# Patient Record
Sex: Male | Born: 1970 | Race: White | Hispanic: No | Marital: Single | Smoking: Current every day smoker
Health system: Southern US, Community
[De-identification: ages and names within clinical notes are randomized; demographics above are authoritative.]

## PROBLEM LIST (undated history)

## (undated) DIAGNOSIS — T7840XA Allergy, unspecified, initial encounter: Secondary | ICD-10-CM

## (undated) DIAGNOSIS — F419 Anxiety disorder, unspecified: Secondary | ICD-10-CM

## (undated) DIAGNOSIS — F32A Depression, unspecified: Secondary | ICD-10-CM

## (undated) DIAGNOSIS — M810 Age-related osteoporosis without current pathological fracture: Secondary | ICD-10-CM

## (undated) DIAGNOSIS — K219 Gastro-esophageal reflux disease without esophagitis: Secondary | ICD-10-CM

## (undated) DIAGNOSIS — J449 Chronic obstructive pulmonary disease, unspecified: Secondary | ICD-10-CM

## (undated) DIAGNOSIS — C801 Malignant (primary) neoplasm, unspecified: Secondary | ICD-10-CM

## (undated) DIAGNOSIS — F329 Major depressive disorder, single episode, unspecified: Secondary | ICD-10-CM

## (undated) HISTORY — DX: Age-related osteoporosis without current pathological fracture: M81.0

## (undated) HISTORY — PX: NEPHRECTOMY: SHX65

## (undated) HISTORY — DX: Anxiety disorder, unspecified: F41.9

## (undated) HISTORY — DX: Depression, unspecified: F32.A

## (undated) HISTORY — DX: Gastro-esophageal reflux disease without esophagitis: K21.9

## (undated) HISTORY — PX: UPPER GASTROINTESTINAL ENDOSCOPY: SHX188

## (undated) HISTORY — DX: Chronic obstructive pulmonary disease, unspecified: J44.9

## (undated) HISTORY — PX: APPENDECTOMY: SHX54

## (undated) HISTORY — PX: BACK SURGERY: SHX140

## (undated) HISTORY — DX: Allergy, unspecified, initial encounter: T78.40XA

## (undated) HISTORY — DX: Malignant (primary) neoplasm, unspecified: C80.1

---
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
## 2016-04-03 ENCOUNTER — Encounter (HOSPITAL_COMMUNITY): Payer: Self-pay | Admitting: Emergency Medicine

## 2016-04-03 ENCOUNTER — Emergency Department (HOSPITAL_COMMUNITY)
Admission: EM | Admit: 2016-04-03 | Discharge: 2016-04-03 | Disposition: A | Discharge status: Home / Self Care | Payer: Self-pay | Attending: Dermatology | Admitting: Dermatology

## 2016-04-03 DIAGNOSIS — Z5321 Procedure and treatment not carried out due to patient leaving prior to being seen by health care provider: Secondary | ICD-10-CM | POA: Insufficient documentation

## 2016-04-03 DIAGNOSIS — G8929 Other chronic pain: Secondary | ICD-10-CM | POA: Insufficient documentation

## 2016-04-03 DIAGNOSIS — M549 Dorsalgia, unspecified: Secondary | ICD-10-CM | POA: Insufficient documentation

## 2016-04-03 NOTE — ED Notes (Signed)
This RN Called Pt in Waiting room x3. No response.

## 2016-04-03 NOTE — ED Triage Notes (Signed)
Per pt, states chronic back pain-states increased pain for past 2 weeks

## 2018-10-23 ENCOUNTER — Encounter: Payer: Self-pay | Admitting: Gastroenterology

## 2018-10-25 ENCOUNTER — Other Ambulatory Visit: Payer: Self-pay

## 2018-10-25 ENCOUNTER — Emergency Department (HOSPITAL_COMMUNITY): Payer: Commercial Managed Care - PPO

## 2018-10-25 ENCOUNTER — Encounter (HOSPITAL_COMMUNITY): Payer: Self-pay

## 2018-10-25 ENCOUNTER — Emergency Department (HOSPITAL_COMMUNITY)
Admission: EM | Admit: 2018-10-25 | Discharge: 2018-10-25 | Disposition: A | Discharge status: Home / Self Care | Payer: Commercial Managed Care - PPO | Attending: Emergency Medicine | Admitting: Emergency Medicine

## 2018-10-25 DIAGNOSIS — R1084 Generalized abdominal pain: Secondary | ICD-10-CM

## 2018-10-25 DIAGNOSIS — F17200 Nicotine dependence, unspecified, uncomplicated: Secondary | ICD-10-CM | POA: Diagnosis not present

## 2018-10-25 DIAGNOSIS — K760 Fatty (change of) liver, not elsewhere classified: Secondary | ICD-10-CM | POA: Diagnosis not present

## 2018-10-25 DIAGNOSIS — J449 Chronic obstructive pulmonary disease, unspecified: Secondary | ICD-10-CM | POA: Insufficient documentation

## 2018-10-25 DIAGNOSIS — R748 Abnormal levels of other serum enzymes: Secondary | ICD-10-CM | POA: Diagnosis not present

## 2018-10-25 DIAGNOSIS — Z20828 Contact with and (suspected) exposure to other viral communicable diseases: Secondary | ICD-10-CM | POA: Diagnosis not present

## 2018-10-25 LAB — RAPID URINE DRUG SCREEN, HOSP PERFORMED
Amphetamines: NOT DETECTED
Barbiturates: NOT DETECTED
Benzodiazepines: POSITIVE — AB
Cocaine: NOT DETECTED
Opiates: NOT DETECTED
Tetrahydrocannabinol: POSITIVE — AB

## 2018-10-25 LAB — COMPREHENSIVE METABOLIC PANEL
ALT: 84 U/L — ABNORMAL HIGH (ref 0–44)
AST: 82 U/L — ABNORMAL HIGH (ref 15–41)
Albumin: 3.8 g/dL (ref 3.5–5.0)
Alkaline Phosphatase: 66 U/L (ref 38–126)
Anion gap: 10 (ref 5–15)
BUN: 9 mg/dL (ref 6–20)
CO2: 27 mmol/L (ref 22–32)
Calcium: 9.1 mg/dL (ref 8.9–10.3)
Chloride: 98 mmol/L (ref 98–111)
Creatinine, Ser: 0.93 mg/dL (ref 0.61–1.24)
GFR calc Af Amer: >60 mL/min (ref >60)
GFR calc non Af Amer: >60 mL/min (ref >60)
Glucose, Bld: 170 mg/dL — ABNORMAL HIGH (ref 70–99)
Potassium: 4.3 mmol/L (ref 3.5–5.1)
Sodium: 135 mmol/L (ref 135–145)
Total Bilirubin: 0.6 mg/dL (ref 0.3–1.2)
Total Protein: 6.5 g/dL (ref 6.5–8.1)

## 2018-10-25 LAB — URINALYSIS, ROUTINE W REFLEX MICROSCOPIC
Bilirubin Urine: NEGATIVE
Glucose, UA: NEGATIVE mg/dL
Hgb urine dipstick: NEGATIVE
Ketones, ur: NEGATIVE mg/dL
Leukocytes,Ua: NEGATIVE
Nitrite: NEGATIVE
Protein, ur: NEGATIVE mg/dL
Specific Gravity, Urine: 1.031 — ABNORMAL HIGH (ref 1.005–1.030)
pH: 5 (ref 5.0–8.0)

## 2018-10-25 LAB — CBC WITH DIFFERENTIAL/PLATELET
Abs Immature Granulocytes: 0.03 10*3/uL (ref 0.00–0.07)
Basophils Absolute: 0 10*3/uL (ref 0.0–0.1)
Basophils Relative: 1 %
Eosinophils Absolute: 0.3 10*3/uL (ref 0.0–0.5)
Eosinophils Relative: 5 %
HCT: 48.5 % (ref 39.0–52.0)
Hemoglobin: 16.2 g/dL (ref 13.0–17.0)
Immature Granulocytes: 1 %
Lymphocytes Relative: 31 %
Lymphs Abs: 1.8 10*3/uL (ref 0.7–4.0)
MCH: 34.6 pg — ABNORMAL HIGH (ref 26.0–34.0)
MCHC: 33.4 g/dL (ref 30.0–36.0)
MCV: 103.6 fL — ABNORMAL HIGH (ref 80.0–100.0)
Monocytes Absolute: 0.4 10*3/uL (ref 0.1–1.0)
Monocytes Relative: 6 %
Neutro Abs: 3.2 10*3/uL (ref 1.7–7.7)
Neutrophils Relative %: 56 %
Platelets: 188 10*3/uL (ref 150–400)
RBC: 4.68 MIL/uL (ref 4.22–5.81)
RDW: 11.9 % (ref 11.5–15.5)
WBC: 5.6 10*3/uL (ref 4.0–10.5)
nRBC: 0 % (ref 0.0–0.2)

## 2018-10-25 LAB — LIPASE, BLOOD: Lipase: 48 U/L (ref 11–51)

## 2018-10-25 LAB — PROTIME-INR
INR: 1 (ref 0.8–1.2)
Prothrombin Time: 13.3 seconds (ref 11.4–15.2)

## 2018-10-25 LAB — AMMONIA: Ammonia: 28 umol/L (ref 9–35)

## 2018-10-25 LAB — SARS CORONAVIRUS 2 (TAT 6-24 HRS): SARS Coronavirus 2: NEGATIVE

## 2018-10-25 LAB — ETHANOL: Alcohol, Ethyl (B): <10 mg/dL (ref <10)

## 2018-10-25 MED ORDER — MORPHINE SULFATE (PF) 4 MG/ML IV SOLN
8.0000 mg | Freq: Once | INTRAVENOUS | Status: AC
  Administered 2018-10-25: 8 mg via INTRAMUSCULAR
  Filled 2018-10-25: qty 2

## 2018-10-25 MED ORDER — AEROCHAMBER PLUS FLO-VU LARGE MISC
Status: AC
  Filled 2018-10-25: qty 1

## 2018-10-25 MED ORDER — AEROCHAMBER PLUS FLO-VU MEDIUM MISC
1.0000 | Freq: Once | Status: AC
  Administered 2018-10-25: 16:00:00 1
  Filled 2018-10-25: qty 1

## 2018-10-25 MED ORDER — ALBUTEROL SULFATE HFA 108 (90 BASE) MCG/ACT IN AERS
2.0000 | INHALATION_SPRAY | RESPIRATORY_TRACT | Status: DC | PRN
  Administered 2018-10-25: 16:00:00 2 via RESPIRATORY_TRACT
  Filled 2018-10-25: qty 6.7

## 2018-10-25 MED ORDER — FENTANYL CITRATE (PF) 100 MCG/2ML IJ SOLN
50.0000 ug | Freq: Once | INTRAMUSCULAR | Status: AC
  Administered 2018-10-25: 50 ug via INTRAVENOUS
  Filled 2018-10-25: qty 2

## 2018-10-25 MED ORDER — IOHEXOL 300 MG/ML  SOLN
100.0000 mL | Freq: Once | INTRAMUSCULAR | Status: AC | PRN
  Administered 2018-10-25: 100 mL via INTRAVENOUS

## 2018-10-25 NOTE — ED Triage Notes (Signed)
States ruptured appy in January and has continued pain since, intermittently.  Has had multiple scans with no diagnosis.  Was seen at UC 2 days ago and had ultrasound with diagnosis of fatty liver without cirrhosis.  Admits to ETOH use approx 4 beers a day.  Starts drinking at lunch time.  After [place] has not had a drink since.  Pt guarding at RUQ, with some dyspnea and cough noted.  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  No N/V/D.  Wife at bedside.

## 2018-10-25 NOTE — ED Notes (Signed)
Pt continues to have pain at a "10".  Msg to MD for pain meds.

## 2018-10-25 NOTE — Discharge Instructions (Addendum)
Please use albuterol inhaler 2 puffs up to every 4 hours Do not drink alcohol, smoke, or use acetaminophen. Return if any fever, unable to tolerate fluids by mouth or worsening symptoms.

## 2018-10-25 NOTE — ED Provider Notes (Signed)
[facility] EMERGENCY DEPARTMENT Provider Note   CSN: [id number] Arrival date & time: 10/25/18  1148     History   Chief Complaint Chief Complaint  Patient presents with   Abdominal Pain    HPI Alan Blackwell is a 48 y.o. male.     HPI  48 yo male a/p appendectomy January 2020 presents co of ongoing abdominal pain since appendectomy last January..  Patient complaining of distension which is worsening and with diffuse pain.  Wife states he gained 25 lbs at time of surgery and has decreased ongoing po intake.  Denies fever, cough, nausea or vomiitng.  Patient with increased dyspnea which he feels is related to abdominal distension.  Patient smoke and drinks daily.  Seen at primary care office yesterday and has had recent ruq [place] which showed fatty inflitrate of liver, but no stones or obstruction.    No past medical history on file.  There are no active problems to display for this patient.   Past Surgical History:  Procedure Laterality Date   BACK SURGERY          Home Medications    Prior to Admission medications   Not on File    Family History No family history on file.  Social History Social History   Tobacco Use   Smoking status: Current Every Day Smoker  Substance Use Topics   Alcohol use: No   Drug use: Not on file     Allergies   Patient has no allergy information on record.   Review of Systems Review of Systems   Physical Exam Updated Vital Signs BP (!) 152/107 (BP Location: Right Arm)    Pulse 99    Temp 98 F (36.7 C) (Oral)    Resp (!) 22    Ht 1.651 m (5\' 5" )    Wt 84.8 kg    SpO2 100%    BMI 31.12 kg/m   Physical Exam Vitals signs and nursing note reviewed.  Constitutional:      Appearance: He is well-developed. He is obese.  HENT:     Head: Normocephalic.     Mouth/Throat:     Mouth: Mucous membranes are moist.  Eyes:     Extraocular Movements: Extraocular movements intact.  Cardiovascular:     Rate and  Rhythm: Normal rate and regular rhythm.  Pulmonary:     Effort: Pulmonary effort is normal.     Breath sounds: Normal breath sounds.  Abdominal:     General: Abdomen is protuberant. Bowel sounds are normal. There is distension.     Tenderness: There is generalized abdominal tenderness.     Comments: Diffuse ttp  Skin:    General: Skin is warm and dry.     Capillary Refill: Capillary refill takes less than 2 seconds.  Neurological:     General: No focal deficit present.     Mental Status: He is alert. He is disoriented.  Psychiatric:        Mood and Affect: Mood normal.        Behavior: Behavior normal.      ED Treatments / Results  Labs (all labs ordered are listed, but only abnormal results are displayed) Labs Reviewed  SARS CORONAVIRUS 2 (TAT 6-24 HRS)  CBC WITH DIFFERENTIAL/PLATELET  COMPREHENSIVE METABOLIC PANEL  LIPASE, BLOOD  PROTIME-INR    EKG None  Radiology Ct Abdomen Pelvis W Contrast  Result Date: 10/25/2018 CLINICAL DATA:  Acute, diffuse abdominal pain. Continued pain since a  ruptured appendix in January 2020. EXAM: CT ABDOMEN AND PELVIS WITH CONTRAST TECHNIQUE: Multidetector CT imaging of the abdomen and pelvis was performed using the standard protocol following bolus administration of intravenous contrast. CONTRAST:  149mL OMNIPAQUE IOHEXOL 300 MG/ML  SOLN COMPARISON:  06/19/2018 FINDINGS: Lower chest: Normal sized heart. Minimal bibasilar dependent atelectasis/scarring. Small right middle lobe bulla. Hepatobiliary: Marked diffuse low density of the liver relative to the spleen with mild progression. There are some ill-defined areas of relative sparing peripherally. Normal appearing gallbladder. Pancreas: Unremarkable. No pancreatic ductal dilatation or surrounding inflammatory changes. Spleen: Normal in size without focal abnormality. Adrenals/Urinary Tract: Adrenal glands are unremarkable. Kidneys are normal, without renal calculi, focal lesion, or hydronephrosis.  Bladder is unremarkable. Stomach/Bowel: Post appendectomy changes. Unremarkable stomach, small bowel and colon. Vascular/Lymphatic: Mild atheromatous arterial calcifications without aneurysm. No enlarged lymph nodes. Reproductive: Prostate is unremarkable. Other: Small bilateral inguinal hernias containing fat. Surgical clips in the lateral aspect of the mid abdomen, compatible with clips placed at the time of appendectomy. Musculoskeletal: Lower thoracic spine degenerative changes. Minimal lumbar spine degenerative changes. IMPRESSION: 1. No acute abnormality. 2. Marked diffuse hepatic steatosis with mild progression. 3. Small bilateral inguinal hernias containing fat. Electronically Signed   By: Claudie Revering M.D.   On: 10/25/2018 14:31   Dg Chest Port 1 View  Result Date: 10/25/2018 CLINICAL DATA:  Dyspnea EXAM: PORTABLE CHEST 1 VIEW COMPARISON:  01/28/2016 FINDINGS: The heart size and mediastinal contours are within normal limits. Minimal bibasilar atelectasis. No pleural effusion, or pneumothorax. The visualized skeletal structures are unremarkable. IMPRESSION: Minimal bibasilar atelectasis. Electronically Signed   By: Davina Poke M.D.   On: 10/25/2018 12:50    Procedures Procedures (including critical care time)  Medications Ordered in ED Medications - No data to display   Initial Impression / Assessment and Plan / ED Course  I have reviewed the triage vital signs and the nursing notes.  Pertinent labs & imaging results that were available during my care of the patient were reviewed by me and considered in my medical decision making (see chart for details).   48 year old male presents today complaining of abdominal pain since appendectomy last January.  He has been evaluated by his primary care doctor and had recent ultrasound and labs which showed mildly elevated LFTs.  He drinks alcohol but reports that he has quit drinking alcohol.  He is on maintenance Subutex but states he quit  taking it 2 days ago as it does not work very well for him.  He also states that he quit taking it because he did not want to block the opiate receptors of a cane for treatment.  His wife is with him.  They are insistent that he had sudden weight gain after the surgery.  I have reviewed the CT scan and discussed it with the radiologist.  There is no evidence of ascites or other acute abnormalities although there is marked hepatic steatosis.  However, can find no evidence of acute findings today.  He was given fentanyl here for pain.  He is requesting further pain medication.  I discussed that I am unable to prescribe further narcotics as an outpatient.  He will be given IM morphine here before he leaves.  He will restart his Subutex when he gets home. I had an extensive conversation with patient and his wife.  See no indication for hospitalization or further treatment at this time.  I suspect that he does have some component of hepatitis.  He is  advised regarding not using any alcohol or acetaminophen. Patient also has COPD and has some wheezing.  This seems to be exacerbated by his abdominal pain.  He is given an albuterol HFA with a spacer and advised regarding its use. Plan referral to GI for further evaluation. They are aware of return precautions and need for follow-up and voiced understanding.    Final Clinical Impressions(s) / ED Diagnoses   Final diagnoses:  Generalized abdominal pain  Chronic obstructive pulmonary disease, unspecified COPD type ([place])  Elevated liver enzymes    ED Discharge Orders    None       Pattricia Boss, MD 10/25/18 1546

## 2018-11-10 ENCOUNTER — Encounter: Payer: Self-pay | Admitting: Gastroenterology

## 2018-11-10 ENCOUNTER — Ambulatory Visit: Payer: Commercial Managed Care - PPO | Admitting: Gastroenterology

## 2018-11-10 ENCOUNTER — Other Ambulatory Visit: Payer: Self-pay

## 2018-11-10 VITALS — BP 138/92 | HR 96 | Temp 98.2°F | Ht 65.0 in | Wt 188.5 lb

## 2018-11-10 DIAGNOSIS — R635 Abnormal weight gain: Secondary | ICD-10-CM | POA: Diagnosis not present

## 2018-11-10 DIAGNOSIS — R109 Unspecified abdominal pain: Secondary | ICD-10-CM | POA: Diagnosis not present

## 2018-11-10 MED ORDER — SUPREP BOWEL PREP KIT 17.5-3.13-1.6 GM/177ML PO SOLN: 1.0000 | ORAL | 0 refills | Status: DC

## 2018-11-10 MED ORDER — PANTOPRAZOLE SODIUM 40 MG PO TBEC: 40.0000 mg | DELAYED_RELEASE_TABLET | Freq: Every day | ORAL | 11 refills | Status: AC

## 2018-11-10 NOTE — Progress Notes (Addendum)
Chief Complaint:   Referring Provider:  No ref. provider found      ASSESSMENT AND PLAN;   #1.  Generalized abdominal pain. Neg CT for any etiology except for significant fatty liver.  He does have history of GERD, alternating diarrhea and constipation.  #2.  Fatty liver with Abn LFTs likely d/t ETOH. R/O other causes.  No definite liver cirrhosis at this time but patient is at a higher risk.  Normal albumin and platelet count.    #3.  Rapid wt gain. ?  Etiology  Plan: - Stop all ETOH and sodas. - I have offered dietary consultation for calorie intake.  He wants to hold off. - protonix 40mg  po qd. - Check CBC, CMP, TSH, celiac, acute hep panel, acute viral hepatitis, autoimmune hepatitis panel (AMA, ASMA and anti-LKM), ANA, iron studies, serum ceruloplasmin, A1AT, celiac screen, GGT, PT INR and HBA1c - Check anti-HAV total Ab and HBsAb.  If negative, would recommend vaccination for hepatitis A and B. - Proceed with EGD/colon. Discussed risks & benefits. (Risks including rare perforation req laparotomy, bleeding after biopsies/polypectomy req blood transfusion, rare chance of missing neoplasms, risks of anesthesia/sedation). Benefits outweigh the risks. Patient agrees to proceed. All the questions were answered. Consent forms given for review. -Discussed gradual weight loss.  Stop all fried foods as well.  Monitor weight.     Addendum-urine tox screen 2 weeks ago was positive for benzodiazepines and THC.  Alan Blackwell, please note to add hemoglobin A1c as well  HPI:    Alan Blackwell is a 48 y.o. male  C/O generalized abdominal pain particularly upper abdominal and lower abdominal discomfort since lap appendicectomy d/t perforated appendix January 2020.  Also complains of progressive weight gain.  " Went for surgery at 145 lb but when discharged few days after, weight was 165 lb".  He weighs 188lb today.  " Not eating much but still gaining weight".  Has gained so much weight that  now he has shortness of breath.  He does not have good appetite.  He has been having significant heartburn despite Protonix.  Complains of abdominal distention especially postprandial.  Has alternating diarrhea and constipation without any definite melena or hematochezia.  Seen in multiple EDs, including [place] on 10/24/2008.  Underwent CT Abdo/pelvis with contrast which did not show any acute abnormalities except for marked diffuse hepatic steatosis, small bilateral inguinal hernias containing fat.  No ascites.  No intra-abdominal abscesses.  Ultrasound on 10/22/2018 also showed hepatic steatosis without ascites.  Color Doppler imaging showed normal direction of blood flow towards the liver.  Labs revealed abnormal AST/ALT as detailed below.  He continues to drink alcohol and smokes every day.  Told me that he would drink 3-4 beers per day.  Has been drinking significant sodas and also water.  SH-works in Occupational psychologist Past Medical History:  Diagnosis Date   GERD (gastroesophageal reflux disease)     Past Surgical History:  Procedure Laterality Date   APPENDECTOMY     BACK SURGERY      Family History  Problem Relation Age of Onset   Lung cancer Father    Cancer Sister    Colon cancer Neg Hx     Social History   Tobacco Use   Smoking status: Current Every Day Smoker    Packs/day: 1.00   Smokeless tobacco: Never Used  Substance Use Topics   Alcohol use: Yes    Alcohol/week: 12.0 standard drinks    Types:  12 Cans of beer per week   Drug use: Not on file    Current Outpatient Medications  Medication Sig Dispense Refill   albuterol (PROVENTIL) (2.5 MG/3ML) 0.083% nebulizer solution Inhale 2.5 mg into the lungs as needed.     ALPRAZolam (XANAX) 0.5 MG tablet Take 0.5 mg by mouth 3 (three) times daily.     buprenorphine (SUBUTEX) 8 MG SUBL SL tablet Place 8 mg under the tongue daily.     mometasone (NASONEX) 50 MCG/ACT nasal spray Place 1  spray into the nose as needed (sinus).      pantoprazole (PROTONIX) 40 MG tablet Take 40 mg by mouth daily.     traZODone (DESYREL) 100 MG tablet Take 200 mg by mouth at bedtime.     No current facility-administered medications for this visit.     Allergies  Allergen Reactions   Naltrexone Nausea And Vomiting    Review of Systems:  Constitutional: Denies fever, chills, diaphoresis, appetite change and fatigue.  HEENT: Denies photophobia, eye pain, redness, hearing loss, ear pain, congestion, sore throat, rhinorrhea, sneezing, mouth sores, neck pain, neck stiffness and tinnitus.   Respiratory: Denies SOB, DOE, cough, chest tightness,  and wheezing.   Cardiovascular: Denies chest pain, palpitations and leg swelling.  Genitourinary: Denies dysuria, urgency, frequency, hematuria, flank pain and difficulty urinating.  Musculoskeletal: Denies myalgias, back pain, joint swelling, arthralgias and gait problem.  Skin: No rash.  Neurological: Denies dizziness, seizures, syncope, weakness, light-headedness, numbness and headaches.  Hematological: Denies adenopathy. Easy bruising, personal or family bleeding history  Psychiatric/Behavioral: Has anxiety or depression     Physical Exam:    BP (!) 138/92    Pulse 96    Temp 98.2 F (36.8 C)    Ht 5\' 5"  (1.651 m)    Wt 188 lb 8 oz (85.5 kg)    SpO2 94%    BMI 31.37 kg/m  Filed Weights   11/10/18 1444  Weight: 188 lb 8 oz (85.5 kg)   Constitutional:  Well-developed, in no acute distress. Psychiatric: Normal mood and affect. Behavior is normal. HEENT: Pupils normal.  Conjunctivae are normal. No scleral icterus. Neck supple.  Cardiovascular: Normal rate, regular rhythm. No edema Pulmonary/chest: Effort normal and breath sounds normal. No wheezing, rales or rhonchi. Abdominal: Soft, mildly distended. Nontender. Bowel sounds active throughout. There are no masses palpable.  Liver palpated 5 cm below the costal margin.  Mildly tender. Rectal:   defered Neurological: Alert and oriented to person place and time. Skin: Skin is warm and dry. No rashes noted.  Data Reviewed: I have personally reviewed following labs and imaging studies  CBC: CBC Latest Ref Rng & Units 10/25/2018  WBC 4.0 - 10.5 K/uL 5.6  Hemoglobin 13.0 - 17.0 g/dL 16.2  Hematocrit 39.0 - 52.0 % 48.5  Platelets 150 - 400 K/uL 188    CMP: CMP Latest Ref Rng & Units 10/25/2018  Glucose 70 - 99 mg/dL 170(H)  BUN 6 - 20 mg/dL 9  Creatinine 0.61 - 1.24 mg/dL 0.93  Sodium 135 - 145 mmol/L 135  Potassium 3.5 - 5.1 mmol/L 4.3  Chloride 98 - 111 mmol/L 98  CO2 22 - 32 mmol/L 27  Calcium 8.9 - 10.3 mg/dL 9.1  Total Protein 6.5 - 8.1 g/dL 6.5  Total Bilirubin 0.3 - 1.2 mg/dL 0.6  Alkaline Phos 38 - 126 U/L 66  AST 15 - 41 U/L 82(H)  ALT 0 - 44 U/L 84(H)  Albumin 3.8  Radiology Studies: Ct Abdomen  Pelvis W Contrast  Result Date: 10/25/2018 CLINICAL DATA:  Acute, diffuse abdominal pain. Continued pain since a ruptured appendix in January 2020. EXAM: CT ABDOMEN AND PELVIS WITH CONTRAST TECHNIQUE: Multidetector CT imaging of the abdomen and pelvis was performed using the standard protocol following bolus administration of intravenous contrast. CONTRAST:  178mL OMNIPAQUE IOHEXOL 300 MG/ML  SOLN COMPARISON:  06/19/2018 FINDINGS: Lower chest: Normal sized heart. Minimal bibasilar dependent atelectasis/scarring. Small right middle lobe bulla. Hepatobiliary: Marked diffuse low density of the liver relative to the spleen with mild progression. There are some ill-defined areas of relative sparing peripherally. Normal appearing gallbladder. Pancreas: Unremarkable. No pancreatic ductal dilatation or surrounding inflammatory changes. Spleen: Normal in size without focal abnormality. Adrenals/Urinary Tract: Adrenal glands are unremarkable. Kidneys are normal, without renal calculi, focal lesion, or hydronephrosis. Bladder is unremarkable. Stomach/Bowel: Post appendectomy changes.  Unremarkable stomach, small bowel and colon. Vascular/Lymphatic: Mild atheromatous arterial calcifications without aneurysm. No enlarged lymph nodes. Reproductive: Prostate is unremarkable. Other: Small bilateral inguinal hernias containing fat. Surgical clips in the lateral aspect of the mid abdomen, compatible with clips placed at the time of appendectomy. Musculoskeletal: Lower thoracic spine degenerative changes. Minimal lumbar spine degenerative changes. IMPRESSION: 1. No acute abnormality. 2. Marked diffuse hepatic steatosis with mild progression. 3. Small bilateral inguinal hernias containing fat. Electronically Signed   By: Claudie Revering M.D.   On: 10/25/2018 14:31   Dg Chest Port 1 View  Result Date: 10/25/2018 CLINICAL DATA:  Dyspnea EXAM: PORTABLE CHEST 1 VIEW COMPARISON:  01/28/2016 FINDINGS: The heart size and mediastinal contours are within normal limits. Minimal bibasilar atelectasis. No pleural effusion, or pneumothorax. The visualized skeletal structures are unremarkable. IMPRESSION: Minimal bibasilar atelectasis. Electronically Signed   By: Davina Poke M.D.   On: 10/25/2018 12:50   CT was reviewed with the patient and patient's family as well.    Carmell Austria, MD 11/10/2018, 3:26 PM  Cc: No ref. provider found

## 2018-11-10 NOTE — Patient Instructions (Signed)
If you are age 49 or older, your body mass index should be between 23-30. Your Body mass index is 31.37 kg/m. If this is out of the aforementioned range listed, please consider follow up with your Primary Care Provider.  If you are age 74 or younger, your body mass index should be between 19-25. Your Body mass index is 31.37 kg/m. If this is out of the aformentioned range listed, please consider follow up with your Primary Care Provider.   You have been scheduled for an endoscopy and colonoscopy. Please follow the written instructions given to you at your visit today. Please pick up your prep supplies at the pharmacy within the next 1-3 days. If you use inhalers (even only as needed), please bring them with you on the day of your procedure. Your physician has requested that you go to www.startemmi.com and enter the access code given to you at your visit today. This web site gives a general overview about your procedure. However, you should still follow specific instructions given to you by our office regarding your preparation for the procedure.  We have sent the following medications to your pharmacy for you to pick up at your convenience: Suprep Protonix  Please go to the lab at [facility] ([place] You will need to go to level "B", you do not need an appointment for this. Hours available are 7:30 am - 4:30 pm.   Thank you,  Dr. Jackquline Denmark

## 2018-11-11 NOTE — Addendum Note (Signed)
Addended by: Karena Addison on: 11/11/2018 09:54 AM   Modules accepted: Orders

## 2018-11-12 ENCOUNTER — Other Ambulatory Visit: Payer: Self-pay

## 2018-11-12 DIAGNOSIS — R109 Unspecified abdominal pain: Secondary | ICD-10-CM

## 2018-11-12 DIAGNOSIS — R635 Abnormal weight gain: Secondary | ICD-10-CM

## 2018-11-12 NOTE — Progress Notes (Signed)
Lab added per Dr. Steve Rattler addendum.

## 2018-11-16 ENCOUNTER — Telehealth: Payer: Self-pay

## 2018-11-16 NOTE — Telephone Encounter (Signed)
Covid-19 screening questions   Do you now or have you had a fever in the last 14 days? NO   Do you have any respiratory symptoms of shortness of breath or cough now or in the last 14 days? NO   Do you have any family members or close contacts with diagnosed or suspected Covid-19 in the past 14 days? NO   Have you been tested for Covid-19 and found to be positive? NO     Questions answered by Caryl Pina spouse.

## 2018-11-17 ENCOUNTER — Other Ambulatory Visit (INDEPENDENT_AMBULATORY_CARE_PROVIDER_SITE_OTHER): Payer: Commercial Managed Care - PPO

## 2018-11-17 ENCOUNTER — Other Ambulatory Visit: Payer: Self-pay

## 2018-11-17 ENCOUNTER — Encounter: Payer: Self-pay | Admitting: Gastroenterology

## 2018-11-17 ENCOUNTER — Ambulatory Visit (AMBULATORY_SURGERY_CENTER): Payer: Commercial Managed Care - PPO | Admitting: Gastroenterology

## 2018-11-17 VITALS — BP 156/97 | HR 74 | Temp 97.8°F | Resp 12 | Ht 65.0 in | Wt 188.0 lb

## 2018-11-17 DIAGNOSIS — K2270 Barrett's esophagus without dysplasia: Secondary | ICD-10-CM | POA: Diagnosis not present

## 2018-11-17 DIAGNOSIS — R109 Unspecified abdominal pain: Secondary | ICD-10-CM

## 2018-11-17 DIAGNOSIS — K2970 Gastritis, unspecified, without bleeding: Secondary | ICD-10-CM | POA: Diagnosis not present

## 2018-11-17 DIAGNOSIS — R635 Abnormal weight gain: Secondary | ICD-10-CM

## 2018-11-17 DIAGNOSIS — K219 Gastro-esophageal reflux disease without esophagitis: Secondary | ICD-10-CM | POA: Diagnosis not present

## 2018-11-17 DIAGNOSIS — D124 Benign neoplasm of descending colon: Secondary | ICD-10-CM

## 2018-11-17 DIAGNOSIS — R197 Diarrhea, unspecified: Secondary | ICD-10-CM | POA: Diagnosis present

## 2018-11-17 DIAGNOSIS — K5730 Diverticulosis of large intestine without perforation or abscess without bleeding: Secondary | ICD-10-CM | POA: Diagnosis not present

## 2018-11-17 DIAGNOSIS — K648 Other hemorrhoids: Secondary | ICD-10-CM

## 2018-11-17 DIAGNOSIS — K635 Polyp of colon: Secondary | ICD-10-CM | POA: Diagnosis not present

## 2018-11-17 DIAGNOSIS — D120 Benign neoplasm of cecum: Secondary | ICD-10-CM

## 2018-11-17 LAB — CBC WITH DIFFERENTIAL/PLATELET
Basophils Absolute: 0.1 10*3/uL (ref 0.0–0.1)
Basophils Relative: 0.9 % (ref 0.0–3.0)
Eosinophils Absolute: 0.2 10*3/uL (ref 0.0–0.7)
Eosinophils Relative: 3.2 % (ref 0.0–5.0)
HCT: 49.2 % (ref 39.0–52.0)
Hemoglobin: 16.6 g/dL (ref 13.0–17.0)
Lymphocytes Relative: 32 % (ref 12.0–46.0)
Lymphs Abs: 2.1 10*3/uL (ref 0.7–4.0)
MCHC: 33.8 g/dL (ref 30.0–36.0)
MCV: 102.2 fl — ABNORMAL HIGH (ref 78.0–100.0)
Monocytes Absolute: 0.4 10*3/uL (ref 0.1–1.0)
Monocytes Relative: 6.6 % (ref 3.0–12.0)
Neutro Abs: 3.8 10*3/uL (ref 1.4–7.7)
Neutrophils Relative %: 57.3 % (ref 43.0–77.0)
Platelets: 237 10*3/uL (ref 150.0–400.0)
RBC: 4.82 Mil/uL (ref 4.22–5.81)
RDW: 13 % (ref 11.5–15.5)
WBC: 6.7 10*3/uL (ref 4.0–10.5)

## 2018-11-17 LAB — HEMOGLOBIN A1C: Hgb A1c MFr Bld: 6.1 % (ref 4.6–6.5)

## 2018-11-17 LAB — TSH: TSH: 2.76 u[IU]/mL (ref 0.35–4.50)

## 2018-11-17 LAB — COMPREHENSIVE METABOLIC PANEL
ALT: 63 U/L — ABNORMAL HIGH (ref 0–53)
AST: 60 U/L — ABNORMAL HIGH (ref 0–37)
Albumin: 4.7 g/dL (ref 3.5–5.2)
Alkaline Phosphatase: 76 U/L (ref 39–117)
BUN: 8 mg/dL (ref 6–23)
CO2: 27 mEq/L (ref 19–32)
Calcium: 9.8 mg/dL (ref 8.4–10.5)
Chloride: 98 mEq/L (ref 96–112)
Creatinine, Ser: 0.95 mg/dL (ref 0.40–1.50)
GFR: 84.34 mL/min (ref >60.00)
Glucose, Bld: 83 mg/dL (ref 70–99)
Potassium: 4 mEq/L (ref 3.5–5.1)
Sodium: 135 mEq/L (ref 135–145)
Total Bilirubin: 0.6 mg/dL (ref 0.2–1.2)
Total Protein: 7.4 g/dL (ref 6.0–8.3)

## 2018-11-17 LAB — GAMMA GT: GGT: 193 U/L — ABNORMAL HIGH (ref 7–51)

## 2018-11-17 LAB — PROTIME-INR
INR: 1.1 ratio — ABNORMAL HIGH (ref 0.8–1.0)
Prothrombin Time: 12.9 s (ref 9.6–13.1)

## 2018-11-17 MED ORDER — SODIUM CHLORIDE 0.9 % IV SOLN: 500.0000 mL | Freq: Once | INTRAVENOUS | Status: DC

## 2018-11-17 NOTE — Op Note (Signed)
[place] Patient Name: Alan Blackwell Procedure Date: 11/17/2018 2:14 PM MRN: [id number] Endoscopist: Jackquline Denmark , MD Age: 48 Referring MD:  Date of Birth: [birth year] Gender: Male Account #: [id number] Procedure:                Upper GI endoscopy Indications:              Epigastric abdominal pain, GERD Medicines:                Monitored Anesthesia Care Procedure:                Pre-Anesthesia Assessment:                           - Prior to the procedure, a History and Physical                            was performed, and patient medications and                            allergies were reviewed. The patient's tolerance of                            previous anesthesia was also reviewed. The risks                            and benefits of the procedure and the sedation                            options and risks were discussed with the patient.                            All questions were answered, and informed consent                            was obtained. Prior Anticoagulants: The patient has                            taken no previous anticoagulant or antiplatelet                            agents. ASA Grade Assessment: II - A patient with                            mild systemic disease. After reviewing the risks                            and benefits, the patient was deemed in                            satisfactory condition to undergo the procedure.                           After obtaining informed consent, the endoscope was  passed under direct vision. Throughout the                            procedure, the patient's blood pressure, pulse, and                            oxygen saturations were monitored continuously. The                            Endoscope was introduced through the mouth, and                            advanced to the second part of duodenum. The upper                            GI endoscopy was accomplished  without difficulty.                            The patient tolerated the procedure well. Scope In: Scope Out: Findings:                 The esophagus was mildly tortuous in the distal one                            third of the esophagus but normal.                           The Z-line was irregular and was found 35 cm from                            the incisors, with salmon-colored mucosa projecting                            1 cm above the GE junction. Biopsies were taken                            with a cold forceps for histology, directed by NBI.                           Localized mild inflammation characterized by                            erythema was found in the gastric antrum. Biopsies                            were taken with a cold forceps for histology.                            Estimated blood loss: none.                           The examined duodenum was normal. Biopsies for  histology were taken with a cold forceps for                            evaluation of celiac disease. Complications:            No immediate complications. Estimated Blood Loss:     Estimated blood loss: none. Impression:               - Z-line irregular with likely short segment                            Barrett's esophagus (biopsied)                           - Mild gastritis. Recommendation:           - Patient has a contact number available for                            emergencies. The signs and symptoms of potential                            delayed complications were discussed with the                            patient. Return to normal activities tomorrow.                            Written discharge instructions were provided to the                            patient.                           - Resume previous diet.                           - Continue Protonix 40 mg p.o. once a day.                           - Await pathology results.                            - Avoid NSAIDs, if possible.                           - Stop drinking all alcohol..                           - Return to GI clinic in 12 weeks. Jackquline Denmark, MD 11/17/2018 2:54:43 PM This report has been signed electronically.

## 2018-11-17 NOTE — Progress Notes (Signed)
PT taken to PACU. Monitors in place. VSS. Report given to RN. 

## 2018-11-17 NOTE — Patient Instructions (Signed)
Please read handouts provided. Continue present medications. Await pathology results. Return to GI clinic in 12 weeks. Avoid alcohol. Avoid NSAIDS, if possible. Continue Protonix 40 mg once daily.        YOU HAD AN ENDOSCOPIC PROCEDURE TODAY AT [facility]:   Refer to the procedure report that was given to you for any specific questions about what was found during the examination.  If the procedure report does not answer your questions, please call your gastroenterologist to clarify.  If you requested that your care partner not be given the details of your procedure findings, then the procedure report has been included in a sealed envelope for you to review at your convenience later.  YOU SHOULD EXPECT: Some feelings of bloating in the abdomen. Passage of more gas than usual.  Walking can help get rid of the air that was put into your GI tract during the procedure and reduce the bloating. If you had a lower endoscopy (such as a colonoscopy or flexible sigmoidoscopy) you may notice spotting of blood in your stool or on the toilet paper. If you underwent a bowel prep for your procedure, you may not have a normal bowel movement for a few days.  Please Note:  You might notice some irritation and congestion in your nose or some drainage.  This is from the oxygen used during your procedure.  There is no need for concern and it should clear up in a day or so.  SYMPTOMS TO REPORT IMMEDIATELY:   Following lower endoscopy (colonoscopy or flexible sigmoidoscopy):  Excessive amounts of blood in the stool  Significant tenderness or worsening of abdominal pains  Swelling of the abdomen that is new, acute  Fever of 100F or higher   Following upper endoscopy (EGD)  Vomiting of blood or coffee ground material  New chest pain or pain under the shoulder blades  Painful or persistently difficult swallowing  New shortness of breath  Fever of 100F or higher  Black, tarry-looking  stools  For urgent or emergent issues, a gastroenterologist can be reached at any hour by calling [phone].   DIET:  We do recommend a small meal at first, but then you may proceed to your regular diet.  Drink plenty of fluids but you should avoid alcoholic beverages for 24 hours.  ACTIVITY:  You should plan to take it easy for the rest of today and you should NOT DRIVE or use heavy machinery until tomorrow (because of the sedation medicines used during the test).    FOLLOW UP: Our staff will call the number listed on your records 48-72 hours following your procedure to check on you and address any questions or concerns that you may have regarding the information given to you following your procedure. If we do not reach you, we will leave a message.  We will attempt to reach you two times.  During this call, we will ask if you have developed any symptoms of COVID 19. If you develop any symptoms (ie: fever, flu-like symptoms, shortness of breath, cough etc.) before then, please call [phone].  If you test positive for Covid 19 in the 2 weeks post procedure, please call and report this information to [place].    If any biopsies were taken you will be contacted by phone or by letter within the next 1-3 weeks.  Please call us at [phone] if you have not heard about the biopsies in 3 weeks.    SIGNATURES/CONFIDENTIALITY: You and/or your  care partner have signed paperwork which will be entered into your electronic medical record.  These signatures attest to the fact that that the information above on your After Visit Summary has been reviewed and is understood.  Full responsibility of the confidentiality of this discharge information lies with you and/or your care-partner.

## 2018-11-17 NOTE — Op Note (Signed)
[place] Patient Name: Ranen Balan Procedure Date: 11/17/2018 2:14 PM MRN: [id number] Endoscopist: Jackquline Denmark , MD Age: 48 Referring MD:  Date of Birth: [birth year] Gender: Male Account #: [id number] Procedure:                Colonoscopy Indications:              Generalized abdominal pain, Clinically significant                            diarrhea of unexplained origin Medicines:                Monitored Anesthesia Care Procedure:                Pre-Anesthesia Assessment:                           - Prior to the procedure, a History and Physical                            was performed, and patient medications and                            allergies were reviewed. The patient's tolerance of                            previous anesthesia was also reviewed. The risks                            and benefits of the procedure and the sedation                            options and risks were discussed with the patient.                            All questions were answered, and informed consent                            was obtained. Prior Anticoagulants: The patient has                            taken no previous anticoagulant or antiplatelet                            agents. ASA Grade Assessment: II - A patient with                            mild systemic disease. After reviewing the risks                            and benefits, the patient was deemed in                            satisfactory condition to undergo the procedure.  After obtaining informed consent, the colonoscope                            was passed under direct vision. Throughout the                            procedure, the patient's blood pressure, pulse, and                            oxygen saturations were monitored continuously. The                            Colonoscope was introduced through the anus and                            advanced to the 2 cm into the  ileum. The                            colonoscopy was performed without difficulty. The                            patient tolerated the procedure well. The quality                            of the bowel preparation was good. The terminal                            ileum, ileocecal valve, appendiceal orifice, and                            rectum were photographed. Scope In: 2:29:18 PM Scope Out: 2:47:33 PM Scope Withdrawal Time: 0 hours 14 minutes 13 seconds  Total Procedure Duration: 0 hours 18 minutes 15 seconds  Findings:                 A 8 mm polyp was found in the cecum. The polyp was                            sessile. The polyp was removed with a cold snare.                            Resection and retrieval were complete.                           Three sessile polyps were found in the mid                            descending colon and distal descending colon. The                            polyps were 4 to 6 mm in size. Smaller polyp was  removed using a cold biopsy forceps and the larger                            with cold snare. Resection and retrieval were                            complete. Estimated blood loss: none.                           A few small-mouthed diverticula were found in the                            sigmoid colon and ascending colon.                           The colonic mucosa otherwise was normal with well                            preserved vascular pattern. Multiple random colonic                            biopsies were obtained to rule out microscopic                            colitis.                           Non-bleeding internal hemorrhoids were found during                            retroflexion. The hemorrhoids were small.                           The terminal ileum appeared normal.                           The exam was otherwise without abnormality on                            direct and retroflexion  views. Complications:            No immediate complications. Estimated Blood Loss:     Estimated blood loss: none. Impression:               -Colonic polyps S/P polypectomy.                           -Mild pancolonic diverticulosis predominantly in                            the sigmoid colon.                           -Otherwise normal colonoscopy to TI. Recommendation:           - Patient has a contact number available for  emergencies. The signs and symptoms of potential                            delayed complications were discussed with the                            patient. Return to normal activities tomorrow.                            Written discharge instructions were provided to the                            patient.                           - Resume previous diet.                           - Continue present medications.                           - Please get the blood work performed today (as                            ordered).                           - Await pathology results.                           - Repeat colonoscopy for surveillance based on                            pathology results.                           - Return to GI clinic in 12 weeks. Jackquline Denmark, MD 11/17/2018 3:06:47 PM This report has been signed electronically.

## 2018-11-17 NOTE — Progress Notes (Signed)
Temperature- [name] VS- [name] 

## 2018-11-17 NOTE — Progress Notes (Signed)
Called to room to assist during endoscopic procedure.  Patient ID and intended procedure confirmed with present staff. Received instructions for my participation in the procedure from the performing physician.  

## 2018-11-19 ENCOUNTER — Telehealth: Payer: Self-pay

## 2018-11-19 LAB — CELIAC PANEL 10
Antigliadin Abs, IgA: 7 units (ref 0–19)
Endomysial IgA: NEGATIVE
Gliadin IgG: 3 units (ref 0–19)
IgA/Immunoglobulin A, Serum: 255 mg/dL (ref 90–386)
Tissue Transglut Ab: <2 U/mL (ref 0–5)
Transglutaminase IgA: <2 U/mL (ref 0–3)

## 2018-11-19 NOTE — Telephone Encounter (Signed)
No answer, left message to call if having any issues or concerns, B.Tamia Dial RN 

## 2018-11-19 NOTE — Telephone Encounter (Signed)
Left message on answering machine. 

## 2018-11-20 LAB — IRON, TOTAL/TOTAL IRON BINDING CAP
%SAT: 19 % (calc) — ABNORMAL LOW (ref 20–48)
Iron: 65 ug/dL (ref 50–180)
TIBC: 337 mcg/dL (calc) (ref 250–425)

## 2018-11-20 LAB — ALPHA-1-ANTITRYPSIN: A-1 Antitrypsin, Ser: 151 mg/dL (ref 83–199)

## 2018-11-20 LAB — HEPATITIS PANEL, ACUTE
Hep A IgM: NONREACTIVE
Hep B C IgM: NONREACTIVE
Hepatitis B Surface Ag: NONREACTIVE
Hepatitis C Ab: NONREACTIVE
SIGNAL TO CUT-OFF: 0.03 (ref <1.00)

## 2018-11-20 LAB — HEPATITIS A ANTIBODY, TOTAL: Hepatitis A AB,Total: REACTIVE — AB

## 2018-11-20 LAB — CERULOPLASMIN: Ceruloplasmin: 28 mg/dL (ref 18–36)

## 2018-11-20 LAB — ANTI-SMOOTH MUSCLE ANTIBODY, IGG: Actin (Smooth Muscle) Antibody (IGG): 45 U — ABNORMAL HIGH (ref <20)

## 2018-11-20 LAB — ANA: Anti Nuclear Antibody (ANA): NEGATIVE

## 2018-11-20 LAB — HEPATITIS B SURFACE ANTIBODY,QUALITATIVE: Hep B S Ab: NONREACTIVE

## 2018-11-20 LAB — MITOCHONDRIAL ANTIBODIES: Mitochondrial M2 Ab, IgG: <=20 U

## 2018-11-22 ENCOUNTER — Encounter: Payer: Self-pay | Admitting: Gastroenterology

## 2018-12-01 ENCOUNTER — Telehealth: Payer: Self-pay | Admitting: Gastroenterology

## 2018-12-01 NOTE — Telephone Encounter (Signed)
Please review lab results and advise 

## 2018-12-11 ENCOUNTER — Other Ambulatory Visit: Payer: Self-pay

## 2018-12-11 ENCOUNTER — Ambulatory Visit (INDEPENDENT_AMBULATORY_CARE_PROVIDER_SITE_OTHER): Payer: Commercial Managed Care - PPO | Admitting: Gastroenterology

## 2018-12-11 DIAGNOSIS — Z23 Encounter for immunization: Secondary | ICD-10-CM | POA: Diagnosis not present

## 2019-12-28 ENCOUNTER — Ambulatory Visit: Payer: Commercial Managed Care - PPO | Admitting: Gastroenterology

## 2020-09-27 ENCOUNTER — Other Ambulatory Visit: Payer: Self-pay | Admitting: Pulmonary Disease

## 2020-09-27 ENCOUNTER — Encounter: Payer: Self-pay | Admitting: Pulmonary Disease

## 2020-09-27 ENCOUNTER — Other Ambulatory Visit: Payer: Self-pay

## 2020-09-27 ENCOUNTER — Ambulatory Visit (INDEPENDENT_AMBULATORY_CARE_PROVIDER_SITE_OTHER): Payer: Medicaid Other | Admitting: Pulmonary Disease

## 2020-09-27 VITALS — BP 130/70 | HR 97 | Ht 60.0 in | Wt 194.8 lb

## 2020-09-27 DIAGNOSIS — R0683 Snoring: Secondary | ICD-10-CM | POA: Diagnosis not present

## 2020-09-27 DIAGNOSIS — R059 Cough, unspecified: Secondary | ICD-10-CM

## 2020-09-27 DIAGNOSIS — Z720 Tobacco use: Secondary | ICD-10-CM

## 2020-09-27 LAB — SARS CORONAVIRUS 2 (TAT 6-24 HRS): SARS Coronavirus 2: NEGATIVE

## 2020-09-27 NOTE — Progress Notes (Signed)
[facility]  Chief Complaint  Patient presents with   Consult    Referred for OSA, states snoring, witnessed apnea, feeling fatigue during daytime    Constitutional:  BP 130/70 (BP Location: Left Arm, Cuff Size: Normal)   Pulse 97   Ht 5' (1.524 m)   Wt 194 lb 12.8 oz (88.4 kg)   SpO2 94%   BMI 38.04 kg/m   Past Medical History:  Allergies, Anxiety, Rt renal cancer 2015, Depression, GERD, Osteoporosis, Hypertension  Past Surgical History:  He  has a past surgical history that includes Back surgery; Appendectomy; Nephrectomy (Right); and Upper gastrointestinal endoscopy.  Brief Summary:  Alan Blackwell is a 50 y.o. male smoker with snoring.      Subjective:   He is applying for disability due to orthopedic issues.  He has been unable to work, and is living with his sister.  He has gained weight since being out of work.  He is snoring more and has more restless sleep.  He stops breathing at night. He has to sleep at 45 degrees due to back pain.  He falls asleep when sitting in a car or watching TV.  He goes to sleep at 9 pm.  He falls asleep in 30 minutes.  He wakes up 3 or 4 times to use the bathroom.  He gets out of bed at 8 am.  He feels tired in the morning.  He denies morning headache.  He does not use anything to help him stay awake.  He takes trazodone at 830 pm.  He denies sleep walking, sleep talking, bruxism, or nightmares.  There is no history of restless legs.  He denies sleep hallucinations, sleep paralysis, or cataplexy.  The Epworth score is 12 out of 24.  He was smoking 2 ppd, but down to 1/2 ppd now.  He is trying to get off cigarettes before he needs to have hip surgery.  He has intermittent cough.  He was told he has COPD, but doesn't recall ever having a PFT before.  Physical Exam:   Appearance - well kempt   ENMT - no sinus tenderness, no oral exudate, no LAN, Mallampati 4 airway, no stridor  Respiratory -  equal breath sounds bilaterally, no wheezing or rales  CV - s1s2 regular rate and rhythm, no murmurs  Ext - no clubbing, no edema  Skin - no rashes  Psych - normal mood and affect   Pulmonary testing:    Chest Imaging:    Sleep Tests:    Cardiac Tests:    Social History:  He  reports that he has been smoking cigarettes. He has been smoking an average of 1 pack per day. He has never used smokeless tobacco. He reports current alcohol use of about 12.0 standard drinks of alcohol per week. He reports that he does not use drugs.  Family History:  His family history includes Cancer in his sister; Lung cancer in his father.    Discussion:  He has snoring, sleep disruption, apnea, and daytime sleepiness.  He has history of hypertension and depression.  His BMI is > 35.  I am concerned he could have obstructive sleep apnea.  He has tobacco abuse and intermittent cough.  He could have obstructive lung disease.  Assessment/Plan:   Snoring with excessive daytime sleepiness. - will need to arrange for a home sleep study  Cough. - will arrange for pulmonary function test to assess whether he has COPD -  prn albuterol  Tobacco abuse. - he is planning to gradually cut down how much he smokes until he has quit  Obesity. - discussed how weight can impact sleep and risk for sleep disordered breathing - discussed options to assist with weight loss: combination of diet modification, cardiovascular and strength training exercises  Cardiovascular risk. - had an extensive discussion regarding the adverse health consequences related to untreated sleep disordered breathing - specifically discussed the risks for hypertension, coronary artery disease, cardiac dysrhythmias, cerebrovascular disease, and diabetes - lifestyle modification discussed  Safe driving practices. - discussed how sleep disruption can increase risk of accidents, particularly when driving - safe driving practices were  discussed  Therapies for obstructive sleep apnea. - if the sleep study shows significant sleep apnea, then various therapies for treatment were reviewed: CPAP, oral appliance, and surgical interventions  Time Spent Involved in Patient Care on Day of Examination:  35 minutes  Follow up:   Patient Instructions  Will arrange for home sleep study and pulmonary function test Will call to arrange for follow up after sleep study reviewed  Medication List:   Allergies as of 09/27/2020       Reactions   Naltrexone Nausea And Vomiting        Medication List        Accurate as of September 27, 2020 10:13 AM. If you have any questions, ask your nurse or doctor.          STOP taking these medications    buprenorphine 8 MG Subl SL tablet Commonly known as: SUBUTEX Stopped by: Chesley Mires, MD       TAKE these medications    albuterol (2.5 MG/3ML) 0.083% nebulizer solution Commonly known as: PROVENTIL Inhale 2.5 mg into the lungs as needed.   ALPRAZolam 0.5 MG tablet Commonly known as: XANAX Take 0.5 mg by mouth 3 (three) times daily.   levocetirizine 5 MG tablet Commonly known as: XYZAL Take by mouth.   lisinopril 10 MG tablet Commonly known as: ZESTRIL Take 10 mg by mouth daily.   mometasone 50 MCG/ACT nasal spray Commonly known as: NASONEX Place 1 spray into the nose as needed (sinus).   pantoprazole 40 MG tablet Commonly known as: PROTONIX Take 1 tablet (40 mg total) by mouth daily.   traZODone 100 MG tablet Commonly known as: DESYREL Take 200 mg by mouth at bedtime.        Signature:  Chesley Mires, MD [place] Pager - [phone] 09/27/2020, 10:13 AM

## 2020-09-27 NOTE — Patient Instructions (Signed)
Will arrange for home sleep study and pulmonary function test Will call to arrange for follow up after sleep study reviewed

## 2020-09-29 ENCOUNTER — Ambulatory Visit (INDEPENDENT_AMBULATORY_CARE_PROVIDER_SITE_OTHER): Payer: Medicaid Other | Admitting: Pulmonary Disease

## 2020-09-29 ENCOUNTER — Other Ambulatory Visit: Payer: Self-pay

## 2020-09-29 DIAGNOSIS — R059 Cough, unspecified: Secondary | ICD-10-CM

## 2020-09-29 LAB — PULMONARY FUNCTION TEST
DL/VA % pred: 90 %
DL/VA: 4.22 ml/min/mmHg/L
DLCO cor % pred: 94 %
DLCO cor: 19.84 ml/min/mmHg
DLCO unc % pred: 94 %
DLCO unc: 19.84 ml/min/mmHg
FEF 25-75 Post: 2.38 L/sec
FEF 25-75 Pre: 1.91 L/sec
FEF2575-%Change-Post: 24 %
FEF2575-%Pred-Post: 91 %
FEF2575-%Pred-Pre: 73 %
FEV1-%Change-Post: 6 %
FEV1-%Pred-Post: 87 %
FEV1-%Pred-Pre: 82 %
FEV1-Post: 2.4 L
FEV1-Pre: 2.27 L
FEV1FVC-%Change-Post: 1 %
FEV1FVC-%Pred-Pre: 98 %
FEV6-%Change-Post: 4 %
FEV6-%Pred-Post: 91 %
FEV6-%Pred-Pre: 87 %
FEV6-Post: 3.07 L
FEV6-Pre: 2.95 L
FEV6FVC-%Pred-Post: 104 %
FEV6FVC-%Pred-Pre: 104 %
FVC-%Change-Post: 4 %
FVC-%Pred-Post: 87 %
FVC-%Pred-Pre: 84 %
FVC-Post: 3.07 L
FVC-Pre: 2.95 L
Post FEV1/FVC ratio: 78 %
Post FEV6/FVC ratio: 100 %
Pre FEV1/FVC ratio: 77 %
Pre FEV6/FVC Ratio: 100 %
RV % pred: 173 %
RV: 2.58 L
TLC % pred: 115 %
TLC: 5.69 L

## 2020-09-29 NOTE — Patient Instructions (Signed)
Full PFT performed today. °

## 2020-09-29 NOTE — Progress Notes (Signed)
Full PFT performed today. °

## 2020-10-19 ENCOUNTER — Telehealth: Payer: Self-pay | Admitting: Pulmonary Disease

## 2020-10-24 NOTE — Telephone Encounter (Signed)
error 

## 2020-11-21 ENCOUNTER — Other Ambulatory Visit: Payer: Self-pay

## 2020-11-21 ENCOUNTER — Ambulatory Visit: Payer: Medicaid Other

## 2020-11-21 DIAGNOSIS — G4733 Obstructive sleep apnea (adult) (pediatric): Secondary | ICD-10-CM

## 2020-11-21 DIAGNOSIS — R0683 Snoring: Secondary | ICD-10-CM

## 2020-11-22 ENCOUNTER — Telehealth: Payer: Self-pay | Admitting: Pulmonary Disease

## 2020-11-22 DIAGNOSIS — G4733 Obstructive sleep apnea (adult) (pediatric): Secondary | ICD-10-CM | POA: Diagnosis not present

## 2020-11-22 NOTE — Telephone Encounter (Signed)
HST 11/21/20 >> AHI 96.7, SpO2 low 61%.  Spent 290.6 min with SpO2 < 89%.   Please inform him that his sleep study shows very severe obstructive sleep apnea.  Please arrange for ROV with me or NP to discuss treatment options.

## 2020-11-22 NOTE — Telephone Encounter (Signed)
Spoke with patient regarding hst results. They verbalized understanding. No further questions. Appt made for 12/04/20 with Rexene Edison, NP to discuss treatment options.   Nothing further needed at this time.

## 2020-11-28 IMAGING — DX DG CHEST 1V PORT
1 series · 1 of 1 positions shown · non-contrast
Comparison: 01/28/2016

CLINICAL DATA: Dyspnea

EXAM:
PORTABLE CHEST 1 VIEW

[chest]
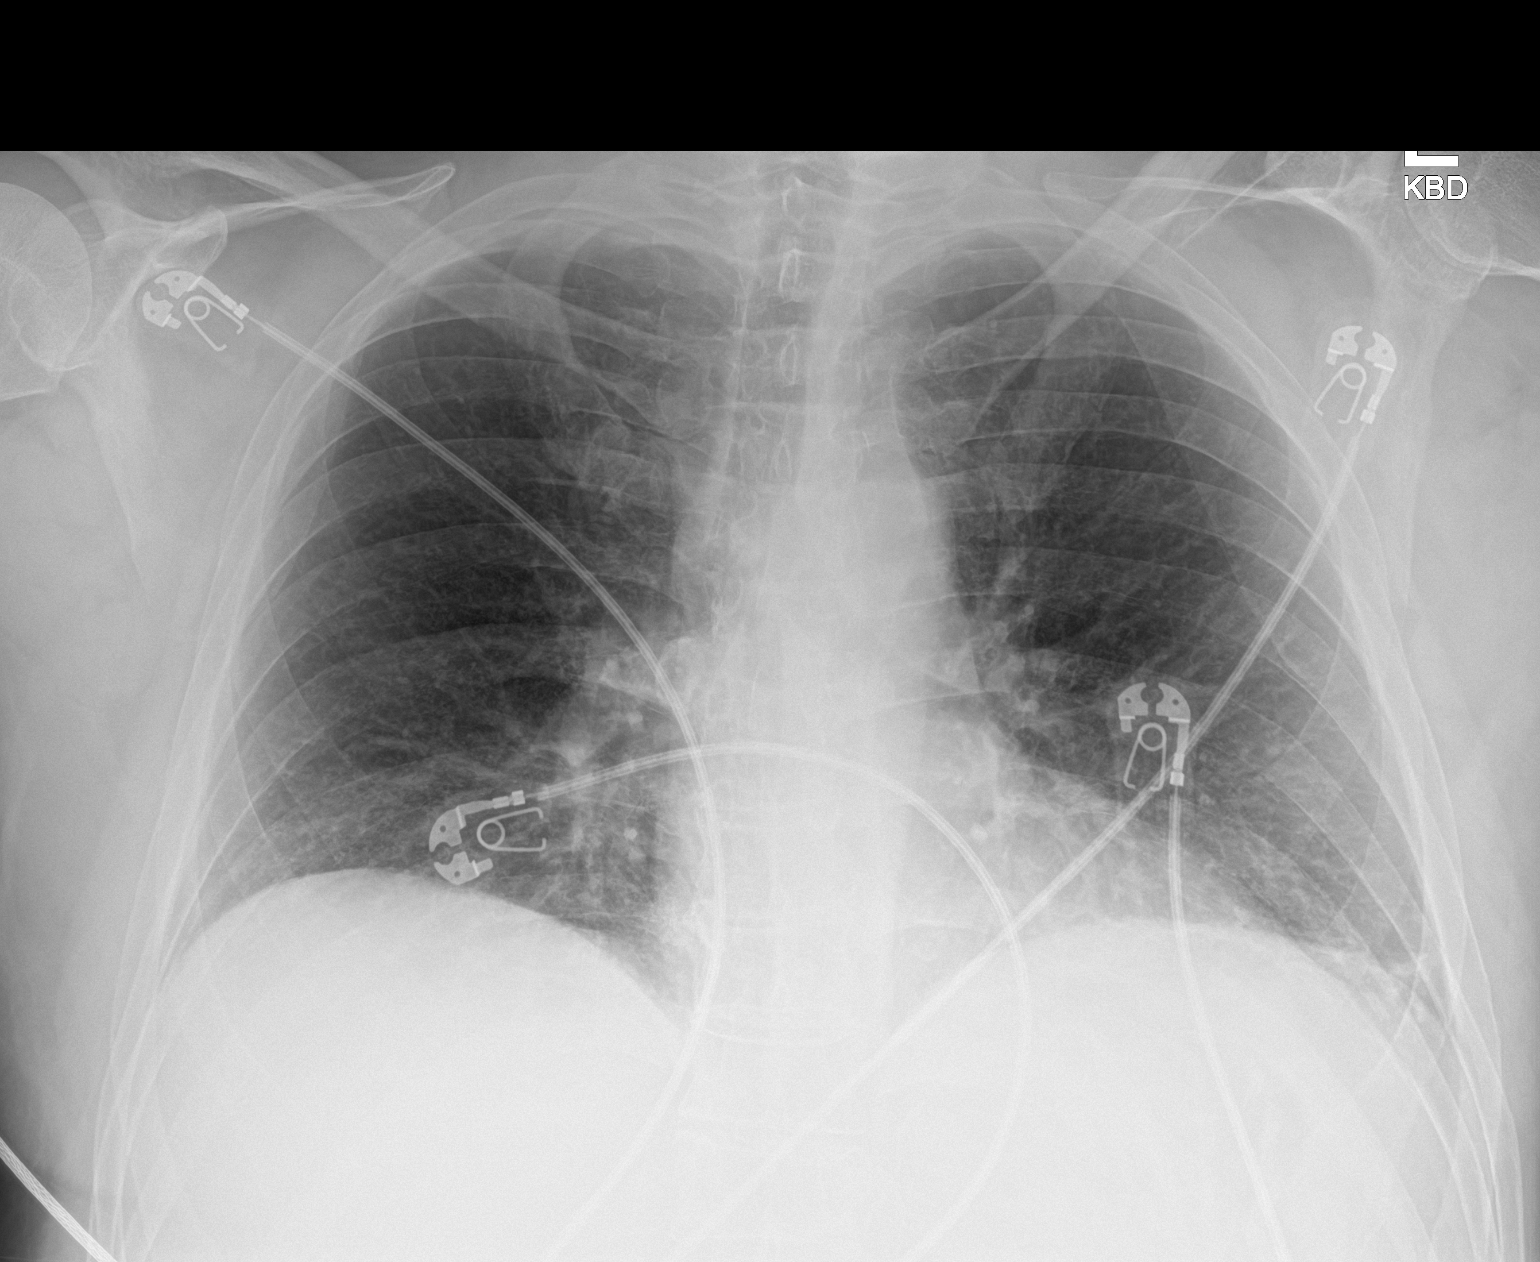

[1 of 1 positions shown; findings below may reference images not displayed]

FINDINGS: The heart size and mediastinal contours are within normal limits.
Minimal bibasilar atelectasis. No pleural effusion, or pneumothorax.
The visualized skeletal structures are unremarkable.
IMPRESSION: Minimal bibasilar atelectasis.

## 2020-12-04 ENCOUNTER — Ambulatory Visit (INDEPENDENT_AMBULATORY_CARE_PROVIDER_SITE_OTHER): Payer: Medicaid Other | Admitting: Adult Health

## 2020-12-04 ENCOUNTER — Encounter: Payer: Self-pay | Admitting: Adult Health

## 2020-12-04 ENCOUNTER — Other Ambulatory Visit: Payer: Self-pay

## 2020-12-04 DIAGNOSIS — G4733 Obstructive sleep apnea (adult) (pediatric): Secondary | ICD-10-CM | POA: Diagnosis not present

## 2020-12-04 NOTE — Patient Instructions (Addendum)
Begin CPAP At bedtime , wear your CPAP all night long .  Work on healthy weight loss.  Do not drive if sleepy .  Call once you get on CPAP and we will set up ONO on CPAP .  Follow up with Dr. Halford Chessman  or Toshi Ishii NP in 2-3 months

## 2020-12-04 NOTE — Progress Notes (Signed)
@Patient  ID: Alan Blackwell, male    DOB: Aug 21, 1970, 50 y.o.   MRN: [id number]  Chief Complaint  Patient presents with   Follow-up    Referring provider: Jettie Booze, NP  HPI: 50 year old male active smoker seen for sleep consult September 27, 2020 for snoring found to have very severe sleep apnea  TEST/EVENTS :  Home sleep study November 21, 2020 AHI 96.7/hour, SPO2 low at 61%, spent 290.6 minutes with SPO2 less than 89%.  12/04/2020 Follow up : OSA  Patient returns for a 17-month follow-up.  Patient was seen last visit for a sleep consult.  Patient was having snoring.  He was set up for home sleep study completed on November 21, 2020 that showed very severe sleep apnea with AHI at 96.7/hour and SPO2 low at 61%. .  Patient has significant hypoxia throughout the night of the sleep study. We discussed his sleep study results in detail.  Went over treatment options including weight loss, oral appliance and CPAP.  Due to patient's severity of sleep apnea he will need to begin on CPAP.  Patient education on CPAP.  We also discussed that he will need a overnight oximetry test on CPAP once he begins his therapy. Has chronic pain and anxiety , on daily narcotics and anxiolytics. Advised on dangers of sleep apnea and sedating medications       Allergies  Allergen Reactions   Naltrexone Nausea And Vomiting   Penicillins Swelling    "could not take a s a child and recently had feet swelling after taking for a week" "could not take a s a child and recently had feet swelling after taking for a week" "could not take a s a child and recently had feet swelling after taking for a week" "could not take a s a child and recently had feet swelling after taking for a week"     Immunization History  Administered Date(s) Administered   Hepatitis B, adult 12/11/2018   Influenza,inj,Quad PF,6+ Mos 11/14/2020   PNEUMOCOCCAL CONJUGATE-20 10/18/2020    Past Medical History:  Diagnosis Date   Allergy     Anxiety    Cancer ([place])    right kidney cancer- 2015   COPD (chronic obstructive pulmonary disease) ([place])    Depression    GERD (gastroesophageal reflux disease)    Osteoporosis     Tobacco History: Social History   Tobacco Use  Smoking Status Every Day   Packs/day: 1.00   Types: Cigarettes  Smokeless Tobacco Never  Tobacco Comments   Smokes 10 cigs per day 12/04/2020   Ready to quit: Not Answered Counseling given: Not Answered Tobacco comments: Smokes 10 cigs per day 12/04/2020   Outpatient Medications Prior to Visit  Medication Sig Dispense Refill   ALPRAZolam (XANAX) 0.5 MG tablet Take 0.5 mg by mouth 3 (three) times daily.     furosemide (LASIX) 20 MG tablet Take 20 mg by mouth daily. For 3 days only, started 12/04/2020.     gabapentin (NEURONTIN) 300 MG capsule Take by mouth.     ibuprofen (ADVIL) 800 MG tablet Take by mouth.     levocetirizine (XYZAL) 5 MG tablet Take by mouth.     lisinopril (ZESTRIL) 10 MG tablet Take 10 mg by mouth daily.     metFORMIN (GLUCOPHAGE-XR) 500 MG 24 hr tablet Take by mouth.     mometasone (NASONEX) 50 MCG/ACT nasal spray Place 1 spray into the nose as needed (sinus).      Oxycodone  HCl 10 MG TABS Take by mouth.     pantoprazole (PROTONIX) 40 MG tablet Take 1 tablet (40 mg total) by mouth daily. 30 tablet 11   traZODone (DESYREL) 100 MG tablet Take 200 mg by mouth at bedtime.     albuterol (PROVENTIL) (2.5 MG/3ML) 0.083% nebulizer solution Inhale 2.5 mg into the lungs as needed. (Patient not taking: Reported on 12/04/2020)     No facility-administered medications prior to visit.     Review of Systems:   Constitutional:   No  weight loss, night sweats,  Fevers, chills,  +fatigue, or  lassitude.  HEENT:   No headaches,  Difficulty swallowing,  Tooth/dental problems, or  Sore throat,                No sneezing, itching, ear ache, nasal congestion, post nasal drip,   CV:  No chest pain,  Orthopnea, PND, swelling in lower  extremities, anasarca, dizziness, palpitations, syncope.   GI  No heartburn, indigestion, abdominal pain, nausea, vomiting, diarrhea, change in bowel habits, loss of appetite, bloody stools.   Resp: No shortness of breath with exertion or at rest.  No excess mucus, no productive cough,  No non-productive cough,  No coughing up of blood.  No change in color of mucus.  No wheezing.  No chest wall deformity  Skin: no rash or lesions.  GU: no dysuria, change in color of urine, no urgency or frequency.  No flank pain, no hematuria   MS:  +hip pain     Physical Exam  BP 122/70 (BP Location: Left Arm, Patient Position: Sitting, Cuff Size: Normal)   Pulse 96   Temp 98 F (36.7 C) (Oral)   Ht 5\' 3"  (1.6 m)   Wt 197 lb 9.6 oz (89.6 kg)   SpO2 96%   BMI 35.00 kg/m   GEN: A/Ox3; pleasant , NAD, well nourished    HEENT:  [place]/AT,   NOSE-clear, THROAT-clear, no lesions, no postnasal drip or exudate noted.   NECK:  Supple w/ fair ROM; no JVD; normal carotid impulses w/o bruits; no thyromegaly or nodules palpated; no lymphadenopathy.    RESP  Clear  P & A; w/o, wheezes/ rales/ or rhonchi. no accessory muscle use, no dullness to percussion  CARD:  RRR, no m/r/g, no peripheral edema, pulses intact, no cyanosis or clubbing.  GI:   Soft & nt; nml bowel sounds; no organomegaly or masses detected.   Musco: Warm bil, no deformities or joint swelling noted.   Neuro: alert, no focal deficits noted.    Skin: Warm, no lesions or rashes    Lab Results:      BNP No results found for: BNP  ProBNP No results found for: PROBNP  Imaging: No results found.    PFT Results Latest Ref Rng & Units 09/29/2020  FVC-Pre L 2.95  FVC-Predicted Pre % 84  FVC-Post L 3.07  FVC-Predicted Post % 87  Pre FEV1/FVC % % 77  Post FEV1/FCV % % 78  FEV1-Pre L 2.27  FEV1-Predicted Pre % 82  FEV1-Post L 2.40  DLCO uncorrected ml/min/mmHg 19.84  DLCO UNC% % 94  DLCO corrected ml/min/mmHg 19.84  DLCO  COR %Predicted % 94  DLVA Predicted % 90  TLC L 5.69  TLC % Predicted % 115  RV % Predicted % 173    No results found for: NITRICOXIDE      Assessment & Plan:   OSA (obstructive sleep apnea) Very severe OSA -patient education given .  Begin CPAP AutoSet 5 to 20 cm H2O. Will need overnight oximetry test once he has started CPAP to evaluate for ongoing nocturnal hypoxemia.  If this is present will need an overnight CPAP titration study. Patient education on use of narcotics and sedating medications with sleep apnea and the potential dangers  Plan  Patient Instructions  Begin CPAP At bedtime , wear your CPAP all night long .  Work on healthy weight loss.  Do not drive if sleepy .  Call once you get on CPAP and we will set up ONO on CPAP .  Follow up with Dr. Halford Chessman  or Patrena Santalucia NP in 2-3 months       Morbid obesity ([place]) Healthy weight loss discussed     Rexene Edison, NP 12/04/2020

## 2020-12-04 NOTE — Addendum Note (Signed)
Addended by: Vanessa Barbara on: 12/04/2020 10:08 AM   Modules accepted: Orders

## 2020-12-04 NOTE — Assessment & Plan Note (Signed)
Very severe OSA -patient education given .  Begin CPAP AutoSet 5 to 20 cm H2O. Will need overnight oximetry test once he has started CPAP to evaluate for ongoing nocturnal hypoxemia.  If this is present will need an overnight CPAP titration study. Patient education on use of narcotics and sedating medications with sleep apnea and the potential dangers  Plan  Patient Instructions  Begin CPAP At bedtime , wear your CPAP all night long .  Work on healthy weight loss.  Do not drive if sleepy .  Call once you get on CPAP and we will set up ONO on CPAP .  Follow up with Dr. Halford Chessman  or Haroldine Redler NP in 2-3 months

## 2020-12-04 NOTE — Assessment & Plan Note (Signed)
Healthy weight loss discussed 

## 2020-12-05 NOTE — Progress Notes (Signed)
Reviewed and agree with assessment/plan.   Chesley Mires, MD [facility] Pulmonary/Critical Care 12/05/2020, 8:47 AM Pager:  [phone]

## 2021-02-08 ENCOUNTER — Ambulatory Visit (INDEPENDENT_AMBULATORY_CARE_PROVIDER_SITE_OTHER): Payer: Medicaid Other | Admitting: Adult Health

## 2021-02-08 ENCOUNTER — Other Ambulatory Visit: Payer: Self-pay

## 2021-02-08 ENCOUNTER — Telehealth: Payer: Self-pay | Admitting: *Deleted

## 2021-02-08 ENCOUNTER — Encounter: Payer: Self-pay | Admitting: Adult Health

## 2021-02-08 DIAGNOSIS — J4520 Mild intermittent asthma, uncomplicated: Secondary | ICD-10-CM

## 2021-02-08 DIAGNOSIS — Z720 Tobacco use: Secondary | ICD-10-CM

## 2021-02-08 DIAGNOSIS — G4733 Obstructive sleep apnea (adult) (pediatric): Secondary | ICD-10-CM

## 2021-02-08 DIAGNOSIS — J45909 Unspecified asthma, uncomplicated: Secondary | ICD-10-CM | POA: Insufficient documentation

## 2021-02-08 NOTE — Patient Instructions (Addendum)
Quit smoking is number goal .  Activity as tolerated .  Albuterol inhaler As needed   Refer to Lung cancer CT chest screening program.  Home care company will get in touch with you to change your BIPAP to CPAP .  Follow up with Dr. Halford Chessman  or Bellanie Matthew in 2  months and As needed

## 2021-02-08 NOTE — Assessment & Plan Note (Signed)
Smoking cessation encouraged!

## 2021-02-08 NOTE — Assessment & Plan Note (Addendum)
PFTs show no significant airflow obstruction or restriction.  There is some air trapping.  Patient may have a component of some emphysema plus or minus asthma with an active smoker Patient has minimum symptoms currently.  Would continue on albuterol as needed. Recommend referral to the low-dose CT screening program. Smoking cessation encouraged  Plan  Patient Instructions  Quit smoking is number goal .  Activity as tolerated .  Albuterol inhaler As needed   Refer to Lung cancer CT chest screening program.  Home care company will get in touch with you to change your BIPAP to CPAP .  Follow up with Dr. Halford Blackwell  or Alan Blackwell in 2  months and As needed

## 2021-02-08 NOTE — Assessment & Plan Note (Addendum)
Severe obstructive sleep apnea.  -We were able to contact his homecare company.  Unfortunately his DME company gave him a BiPAP mistakenly.  They have assured [place] that they will change out his BiPAP for CPAP with the original order at AutoSet 5 to 20 cm H2O.  We will have patient come back in and 6 to 8 weeks for a download. On return visit will evaluate if he needs a overnight oximetry test on CPAP once he is controlled as he has significant desaturations on his initial home sleep study  Plan  Patient Instructions  Quit smoking is number goal .  Activity as tolerated .  Albuterol inhaler As needed   Refer to Lung cancer CT chest screening program.  Home care company will get in touch with you to change your BIPAP to CPAP .  Follow up with Dr. Halford Chessman  or Orva Gwaltney in 2  months and As needed

## 2021-02-08 NOTE — Progress Notes (Signed)
@Patient  ID: Alan Blackwell, male    DOB: 11-Dec-1970, 50 y.o.   MRN: [id number]  Chief Complaint  Patient presents with   Follow-up    Referring provider: Jettie Booze, NP  HPI: 50 year old male active smoker seen for sleep consult September 27, 2020 for snoring found to have very severe sleep apnea  TEST/EVENTS :  Home sleep study November 21, 2020 AHI 96.7/hour, SPO2 low at 61%, spent 290.6 minutes with SPO2 less than 89%. PFTs September 29, 2020 showed normal lung function with FEV1 at 87%, ratio 78, FVC 87%, no significant bronchodilator response, DLCO 94%, positive air trapping noted  02/08/2021 Follow up: OSA  Patient returns for a 81-month follow-up.  Patient was recently seen for sleep consult and set up for home sleep study.  He was found to have very severe sleep apnea with AHI at 96.7/hour and SPO2 low at 61%. Patient returns today after starting his CPAP.  Patient says that he feels that his  pressure is too high.  Download shows that patient has been started on a BiPAP.  We have contacted his homecare company as original order was for CPAP with AutoSet at 5 to 20 cm H2O. BiPAP download does show improved compliance over the last 2 weeks with average usage around 6 to 7 hours.  AHI at 2.1.  Patient current settings on BiPAP-IPAP 23 cm H2O and EPAP at 19 cm H2O.   Patient has a history of smoking.  Has been told that he may have asthma or COPD in the past.  He has smoked 1 to 2 packs daily of cigarettes for about 35 years.  He has cut down to half a pack recently.  We discussed in detail smoking cessation.  Patient says he has occasional cough or wheezing.  If he gets sick with a cold or bronchitis symptoms seem to take a long time to get over.  Patient did have PFTs that were done in September 29, 2020 that showed normal lung function with some air trapping noted.  FEV1 was 87%, ratio 78, FVC 87%, no significant bronchodilator response.  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  Patient is very limited in his activities due to severe hip pain.  Patient is planning for a left hip replacement in February 2023.  He has a history of avascular necrosis  Declines covid vaccine , flu utd.   Allergies  Allergen Reactions   Naltrexone Nausea And Vomiting   Penicillins Swelling    "could not take a s a child and recently had feet swelling after taking for a week" "could not take a s a child and recently had feet swelling after taking for a week" "could not take a s a child and recently had feet swelling after taking for a week" "could not take a s a child and recently had feet swelling after taking for a week"     Immunization History  Administered Date(s) Administered   Hepatitis B, adult 12/11/2018   Influenza,inj,Quad PF,6+ Mos 11/14/2020   Influenza,inj,quad, With Preservative 11/14/2020   PNEUMOCOCCAL CONJUGATE-20 10/18/2020    Past Medical History:  Diagnosis Date   Allergy    Anxiety    Cancer ([place])    right kidney cancer- 2015   COPD (chronic obstructive pulmonary disease) ([place])    Depression    GERD (gastroesophageal reflux disease)    Osteoporosis     Tobacco History: Social History   Tobacco Use  Smoking Status Every Day   Packs/day: 1.00   Types: Cigarettes  Smokeless Tobacco Never  Tobacco Comments   Smokes 1/2 ppd as of 02/08/2021 hfb   Ready to quit: No Counseling given: Yes Tobacco comments: Smokes 1/2 ppd as of 02/08/2021 hfb   Outpatient Medications Prior to Visit  Medication Sig Dispense Refill   albuterol (PROVENTIL) (2.5 MG/3ML) 0.083% nebulizer solution Inhale 2.5 mg into the lungs as needed.     ALPRAZolam (XANAX) 0.5 MG tablet Take 0.5 mg by mouth 3 (three) times daily.     furosemide (LASIX) 20 MG tablet Take 20 mg by mouth daily. For 3 days only, started 12/04/2020.     gabapentin (NEURONTIN) 300 MG capsule Take by mouth.     ibuprofen (ADVIL) 800 MG tablet Take by mouth.      levocetirizine (XYZAL) 5 MG tablet Take by mouth.     lisinopril (ZESTRIL) 10 MG tablet Take 10 mg by mouth daily.     metFORMIN (GLUCOPHAGE-XR) 500 MG 24 hr tablet Take by mouth.     mometasone (NASONEX) 50 MCG/ACT nasal spray Place 1 spray into the nose as needed (sinus).      pantoprazole (PROTONIX) 40 MG tablet Take 1 tablet (40 mg total) by mouth daily. 30 tablet 11   traZODone (DESYREL) 100 MG tablet Take 200 mg by mouth at bedtime.     No facility-administered medications prior to visit.     Review of Systems:   Constitutional:   No  weight loss, night sweats,  Fevers, chills,  +fatigue, or  lassitude.  HEENT:   No headaches,  Difficulty swallowing,  Tooth/dental problems, or  Sore throat,                No sneezing, itching, ear ache, nasal congestion, post nasal drip,   CV:  No chest pain,  Orthopnea, PND, swelling in lower extremities, anasarca, dizziness, palpitations, syncope.   GI  No heartburn, indigestion, abdominal pain, nausea, vomiting, diarrhea, change in bowel habits, loss of appetite, bloody stools.   Resp: .  No chest wall deformity  Skin: no rash or lesions.  GU: no dysuria, change in color of urine, no urgency or frequency.  No flank pain, no hematuria   MS: Positive hip pain  Physical Exam  BP 120/84 (BP Location: Left Arm, Patient Position: Sitting, Cuff Size: Large)    Pulse 92    Temp 98.3 F (36.8 C) (Oral)    Ht 5\' 3"  (1.6 m)    Wt 193 lb 9.6 oz (87.8 kg)    SpO2 99%    BMI 34.29 kg/m   GEN: A/Ox3; pleasant , NAD, well nourished , walking cane    HEENT:  /AT,   NOSE-clear, THROAT-clear, no lesions, no postnasal drip or exudate noted.  Class II-III MP airway  NECK:  Supple w/ fair ROM; no JVD; normal carotid impulses w/o bruits; no thyromegaly or nodules palpated; no lymphadenopathy.    RESP  Clear  P & A; w/o, wheezes/ rales/ or rhonchi. no accessory muscle use, no dullness to percussion  CARD:  RRR, no m/r/g, no peripheral edema, pulses  intact, no cyanosis or clubbing.  GI:   Soft & nt; nml bowel sounds; no organomegaly or masses detected.   Musco: Warm bil, no deformities or joint swelling noted.   Neuro: alert, no focal deficits noted.    Skin: Warm, no lesions or rashes    Lab Results:    BMET   BNP  No results found for: BNP  ProBNP No results found for: PROBNP  Imaging: No results found.    PFT Results Latest Ref Rng & Units 09/29/2020  FVC-Pre L 2.95  FVC-Predicted Pre % 84  FVC-Post L 3.07  FVC-Predicted Post % 87  Pre FEV1/FVC % % 77  Post FEV1/FCV % % 78  FEV1-Pre L 2.27  FEV1-Predicted Pre % 82  FEV1-Post L 2.40  DLCO uncorrected ml/min/mmHg 19.84  DLCO UNC% % 94  DLCO corrected ml/min/mmHg 19.84  DLCO COR %Predicted % 94  DLVA Predicted % 90  TLC L 5.69  TLC % Predicted % 115  RV % Predicted % 173    No results found for: NITRICOXIDE      Assessment & Plan:   OSA (obstructive sleep apnea) Severe obstructive sleep apnea.  -We were able to contact his homecare company.  Unfortunately his DME company gave him a BiPAP mistakenly.  They have assured [place] that they will change out his BiPAP for CPAP with the original order at AutoSet 5 to 20 cm H2O.  We will have patient come back in and 6 to 8 weeks for a download. On return visit will evaluate if he needs a overnight oximetry test on CPAP once he is controlled as he has significant desaturations on his initial home sleep study  Plan  Patient Instructions  Quit smoking is number goal .  Activity as tolerated .  Albuterol inhaler As needed   Refer to Lung cancer CT chest screening program.  Home care company will get in touch with you to change your BIPAP to CPAP .  Follow up with Dr. Halford Chessman  or [place]ar[place] in 2  months and As needed           Morbid obesity ([place]) Healthy weight loss discussed  Tobacco abuse Smoking cessation encouraged  Asthma PFTs show no significant airflow obstruction or restriction.  There is some  air trapping.  Patient may have a component of some emphysema plus or minus asthma with an active smoker Patient has minimum symptoms currently.  Would continue on albuterol as needed. Recommend referral to the low-dose CT screening program. Smoking cessation encouraged  Plan  Patient Instructions  Quit smoking is number goal .  Activity as tolerated .  Albuterol inhaler As needed   Refer to Lung cancer CT chest screening program.  Home care company will get in touch with you to change your BIPAP to CPAP .  Follow up with Dr. Halford Chessman  or [place] in 2  months and As needed              Rexene Edison, NP 02/08/2021

## 2021-02-08 NOTE — Addendum Note (Signed)
Addended by: Vanessa Barbara on: 02/08/2021 11:51 AM   Modules accepted: Orders

## 2021-02-08 NOTE — Progress Notes (Signed)
Reviewed and agree with assessment/plan.   Chesley Mires, MD [facility] Pulmonary/Critical Care 02/08/2021, 11:58 AM Pager:  [phone]

## 2021-02-08 NOTE — Assessment & Plan Note (Signed)
Healthy weight loss discussed 

## 2021-02-08 NOTE — Telephone Encounter (Signed)
Called [facility] and spoke with Melissa regarding download for patient, she stated he had a Bipap.  Download received.  Spoke with Rexene Edison NP, she states a CPAP was ordered on 1017/22 and there is no order to change the order from a CPAP to a BIPAP.  Called and spoke Seth Bake ([facility]) regarding order for CPAP and patient receiving a BIPAP.  She states she put in for a CPAP and was then told to change it to a BIPAP.  She will pull the chart and call me back.  Will await return call.

## 2021-05-08 ENCOUNTER — Ambulatory Visit (INDEPENDENT_AMBULATORY_CARE_PROVIDER_SITE_OTHER): Payer: Medicaid Other | Admitting: Pulmonary Disease

## 2021-05-08 ENCOUNTER — Encounter: Payer: Self-pay | Admitting: Pulmonary Disease

## 2021-05-08 ENCOUNTER — Other Ambulatory Visit: Payer: Self-pay

## 2021-05-08 VITALS — BP 108/62 | HR 64 | Temp 97.6°F | Ht 63.0 in | Wt 177.4 lb

## 2021-05-08 DIAGNOSIS — G4733 Obstructive sleep apnea (adult) (pediatric): Secondary | ICD-10-CM

## 2021-05-08 DIAGNOSIS — Z720 Tobacco use: Secondary | ICD-10-CM | POA: Diagnosis not present

## 2021-05-08 MED ORDER — FLUTICASONE PROPIONATE 50 MCG/ACT NA SUSP: 1.0000 | Freq: Every day | NASAL | 2 refills | Status: DC | PRN

## 2021-05-08 NOTE — Progress Notes (Signed)
? ?[place] Pul[facility], Critical Care, and Sleep Medicine ? ?Chief Complaint  ?Patient presents with  ? Follow-up  ?  Wearing CPAP-Doing good  ? ? ?Constitutional:  ?BP 108/62 (BP Location: Left Arm, Cuff Size: Normal)   Pulse 64   Temp 97.6 ?F (36.4 ?C) (Temporal)   Ht '5\' 3"'$  (1.6 m)   Wt 177 lb 7.2 oz (80.5 kg)   SpO2 99%   BMI 31.43 kg/m?  ? ?Past Medical History:  ?Allergies, Anxiety, Rt renal cancer 2015, Depression, GERD, Osteoporosis, Hypertension ? ?Past Surgical History:  ?He  has a past surgical history that includes Back surgery; Appendectomy; Nephrectomy (Right); and Upper gastrointestinal endoscopy. ? ?Brief Summary:  ?Alan Blackwell is a 51 y.o. male smoker with obstructive sleep apnea. ?  ? ? ? ?Subjective:  ? ?His sleep study showed severe sleep apnea.  His PFT was normal. ? ?He is using CPAP nightly.  Sleeping better.  No issues with mask fit. ? ?Had Lt hip surgery about 5 weeks ago.  Will have Rt hip surgery later this year. ? ?Down to 1/2 ppd.  Plans to gradually quit. ? ?Physical Exam:  ? ?Appearance - well kempt  ? ?ENMT - no sinus tenderness, no oral exudate, no LAN, Mallampati 3 airway, no stridor ? ?Respiratory - equal breath sounds bilaterally, no wheezing or rales ? ?CV - s1s2 regular rate and rhythm, no murmurs ? ?Ext - no clubbing, no edema ? ?Skin - no rashes ? ?Psych - normal mood and affect ? ?  ?Pulmonary testing:  ?PFT 09/29/20 >> FEV1 2.40 (87%), FEV1% 78, TLC 2.58 (173%), DLCO 94% ? ?Sleep Tests:  ?HST 11/21/20 >> AHI 96.7, SpO2 low 61%.  Spent 290.6 min with SpO2 < 89%. ?Auto CPAP 04/07/21 to 05/06/21 >> used on 29 of 30 nights with average 6 hrs 41 min.  Average AHI 3.1 with median CPAP 8 and 95 th percentile CPAP 12 cm H2O ? ?Cardiac Tests:  ?Echo 12/12/20 >> EF 60 to 65% ? ?Social History:  ?He  reports that he has been smoking cigarettes. He has been smoking an average of 1 pack per day. He has never used smokeless tobacco. He reports current alcohol use of about 12.0 standard  drinks per week. He reports that he does not use drugs. ? ?Family History:  ?His family history includes Cancer in his sister; Lung cancer in his father. ?  ? ? ?Assessment/Plan:  ? ?Obstructive sleep apnea. ?- he is compliant with CPAP and reports benefit from therapy ?- uses [employer] for his DME ?- continue auto CPAP 5 to 20 cm H2O ? ?Tobacco abuse. ?- he plans to gradually quit ?- advised him to set a quit date to help set as a goal ? ?Obesity. ?- discussed how weight can impact sleep and risk for sleep disordered breathing ?- discussed options to assist with weight loss: combination of diet modification, cardiovascular and strength training exercises ? ?Time Spent Involved in Patient Care on Day of Examination:  ?26 minutes ? ?Follow up:  ? ?Patient Instructions  ?Follow up in 1 year ? ?Medication List:  ? ?Allergies as of 05/08/2021   ? ?   Reactions  ? Naltrexone Nausea And Vomiting  ? Penicillins Swelling  ? "could not take a s a child and recently had feet swelling after taking for a week" ?"could not take a s a child and recently had feet swelling after taking for a week" ?"could not take a s a child and  recently had feet swelling after taking for a week" ?"could not take a s a child and recently had feet swelling after taking for a week"  ? ?  ? ?  ?Medication List  ?  ? ?  ? Accurate as of May 08, 2021 10:04 AM. If you have any questions, ask your nurse or doctor.  ?  ?  ? ?  ? ?STOP taking these medications   ? ?albuterol (2.5 MG/3ML) 0.083% nebulizer solution ?Commonly known as: PROVENTIL ?Stopped by: Chesley Mires, MD ?  ?mometasone 50 MCG/ACT nasal spray ?Commonly known as: NASONEX ?Stopped by: Chesley Mires, MD ?  ? ?  ? ?TAKE these medications   ? ?ALPRAZolam 0.5 MG tablet ?Commonly known as: Duanne Moron ?Take 0.5 mg by mouth 3 (three) times daily. ?  ?fluticasone 50 MCG/ACT nasal spray ?Commonly known as: FLONASE ?Place 1 spray into both nostrils daily as needed for allergies or rhinitis. ?Started by: Chesley Mires, MD ?  ?furosemide 20 MG tablet ?Commonly known as: LASIX ?Take 20 mg by mouth daily. For 3 days only, started 12/04/2020. ?  ?gabapentin 300 MG capsule ?Commonly known as: NEURONTIN ?Take by mouth. ?  ?ibuprofen 800 MG tablet ?Commonly known as: ADVIL ?Take by mouth. ?  ?levocetirizine 5 MG tablet ?Commonly known as: XYZAL ?Take by mouth. ?  ?lisinopril 10 MG tablet ?Commonly known as: ZESTRIL ?Take 10 mg by mouth daily. ?  ?metFORMIN 500 MG 24 hr tablet ?Commonly known as: GLUCOPHAGE-XR ?Take by mouth. ?  ?metoprolol succinate 25 MG 24 hr tablet ?Commonly known as: TOPROL-XL ?Take 25 mg by mouth. One daily ?  ?Ozempic (0.25 or 0.5 MG/DOSE) 2 MG/1.5ML Sopn ?Generic drug: Semaglutide(0.25 or 0.'5MG'$ /DOS) ?Once weekly ?  ?pantoprazole 40 MG tablet ?Commonly known as: PROTONIX ?Take 1 tablet (40 mg total) by mouth daily. ?  ?traZODone 100 MG tablet ?Commonly known as: DESYREL ?Take 200 mg by mouth at bedtime. ?  ? ?  ? ? ?Signature:  ?Chesley Mires, MD ?[place] ?Pager - [phone] ?05/08/2021, 10:04 AM ?  ? ? ? ? ? ? ? ? ?

## 2021-05-08 NOTE — Patient Instructions (Signed)
Follow up in 1 year.

## 2021-05-16 ENCOUNTER — Telehealth: Payer: Self-pay | Admitting: Pulmonary Disease

## 2021-05-16 NOTE — Telephone Encounter (Signed)
Called [facility] and spoke with Shicora to get fax number that pt's OV notes needed to be sent to. Per Chinita Pester, fax number is [phone]. Notes have been printed to be faxed to [facility] at provided fax number. Nothing further needed. ?

## 2021-06-22 ENCOUNTER — Telehealth: Payer: Self-pay | Admitting: Pulmonary Disease

## 2021-06-22 NOTE — Telephone Encounter (Signed)
Dr. Halford Chessman completed [place] for social security for law office [place], [place].  I faxed signed form to [phone].  ?

## 2021-11-08 ENCOUNTER — Other Ambulatory Visit: Payer: Self-pay | Admitting: *Deleted

## 2021-11-08 MED ORDER — FLUTICASONE PROPIONATE 50 MCG/ACT NA SUSP: 1.0000 | Freq: Every day | NASAL | 2 refills | Status: AC | PRN

## 2021-12-14 ENCOUNTER — Encounter: Payer: Self-pay | Admitting: Gastroenterology

## 2022-07-25 ENCOUNTER — Ambulatory Visit (INDEPENDENT_AMBULATORY_CARE_PROVIDER_SITE_OTHER): Payer: Medicare Other | Admitting: Pulmonary Disease

## 2022-07-25 ENCOUNTER — Encounter (HOSPITAL_BASED_OUTPATIENT_CLINIC_OR_DEPARTMENT_OTHER): Payer: Self-pay | Admitting: Pulmonary Disease

## 2022-07-25 VITALS — BP 116/72 | HR 61 | Ht 63.0 in | Wt 170.0 lb

## 2022-07-25 DIAGNOSIS — Z716 Tobacco abuse counseling: Secondary | ICD-10-CM | POA: Diagnosis not present

## 2022-07-25 DIAGNOSIS — R058 Other specified cough: Secondary | ICD-10-CM

## 2022-07-25 DIAGNOSIS — G4733 Obstructive sleep apnea (adult) (pediatric): Secondary | ICD-10-CM | POA: Diagnosis not present

## 2022-07-25 MED ORDER — AZELASTINE HCL 0.15 % NA SOLN: 1.0000 | Freq: Every day | NASAL | Status: AC | PRN

## 2022-07-25 NOTE — Patient Instructions (Signed)
You can try using astepro in the morning and flonase at night to help with allergies, sinus congestion, and post nasal drip.  You can use mucinex to help with chest congestion and cough.  Follow up in 1 year.

## 2022-07-25 NOTE — Progress Notes (Signed)
[place] [facility], Critical Care, and Sleep Medicine  Chief Complaint  Patient presents with   Follow-up    69yr f/u for OSA. Still using his cpap machine. Also states he has a current sinus infection.     Constitutional:  BP 116/72   Pulse 61   Ht 5\' 3"  (1.6 m)   Wt 170 lb (77.1 kg)   SpO2 99% Comment: on RA  BMI 30.11 kg/m   Past Medical History:  Allergies, Anxiety, Rt renal cancer 2015, Depression, GERD, Osteoporosis, Hypertension  Past Surgical History:  He  has a past surgical history that includes Back surgery; Appendectomy; Nephrectomy (Right); and Upper gastrointestinal endoscopy.  Brief Summary:  Alan Blackwell is a 52 y.o. male smoker with obstructive sleep apnea.      Subjective:   He still smokes 1/2 ppd.  He wants to quit.  Uses CPAP nightly.  Has full face mask.  Has trouble with mask fit when he has more allergies.  Gets sinus congestion, post nasal drip and then gets  cough.  Hasn't been using his allergy medicines recently.  Not having chest congestion, wheeze, or shortness of breath.  Physical Exam:   Appearance - well kempt   ENMT - no sinus tenderness, no oral exudate, no LAN, Mallampati 3 airway, no stridor  Respiratory - equal breath sounds bilaterally, no wheezing or rales  CV - s1s2 regular rate and rhythm, no murmurs  Ext - no clubbing, no edema  Skin - no rashes  Psych - normal mood and affect    Pulmonary testing:  PFT 09/29/20 >> FEV1 2.40 (87%), FEV1% 78, TLC 2.58 (173%), DLCO 94%  Sleep Tests:  HST 11/21/20 >> AHI 96.7, SpO2 low 61%.  Spent 290.6 min with SpO2 < 89%. Auto CPAP 04/23/22 to 07/21/22 >> used on 81 of 90 nights with average 6 hrs 30 min.  Average AHI 2.1 with median CPAP 10 and 95 th percentile CPAP 13 cm H2O  Cardiac Tests:  Echo 12/12/20 >> EF 60 to 65%  Social History:  He  reports that he has been smoking cigarettes. He has been smoking an average of 1 pack per day. He has never used smokeless tobacco. He  reports current alcohol use of about 12.0 standard drinks of alcohol per week. He reports that he does not use drugs.  Family History:  His family history includes Cancer in his sister; Lung cancer in his father.     Assessment/Plan:   Obstructive sleep apnea. - he is compliant with CPAP and reports benefit from therapy - he uses [facility] for his DME - current CPAP ordered October 2022 - continue auto CPAP 5 to 15 cm H2O  Tobacco abuse. - he plans to gradually quit and try nicotine replacement - advised him to set a quit date to help set as a goal - spent 7 minutes discussion smoking cessation options  Upper airway cough syndrome. - he can try astepro in the morning and flonase at night - prn mucinex  Time Spent Involved in Patient Care on Day of Examination:  36 minutes  Follow up:   Patient Instructions  You can try using astepro in the morning and flonase at night to help with allergies, sinus congestion, and post nasal drip.  You can use mucinex to help with chest congestion and cough.  Follow up in 1 year.  Medication List:   Allergies as of 07/25/2022       Reactions   Naltrexone Nausea And  Vomiting   Penicillins Swelling   "could not take a s a child and recently had feet swelling after taking for a week" "could not take a s a child and recently had feet swelling after taking for a week" "could not take a s a child and recently had feet swelling after taking for a week" "could not take a s a child and recently had feet swelling after taking for a week"        Medication List        Accurate as of July 25, 2022 10:52 AM. If you have any questions, ask your nurse or doctor.          ALPRAZolam 0.5 MG tablet Commonly known as: XANAX Take 0.5 mg by mouth 3 (three) times daily.   Azelastine HCl 0.15 % Soln Commonly known as: Astepro Place 1 spray into the nose daily as needed. Started by: Coralyn Helling, MD   fluticasone 50 MCG/ACT nasal  spray Commonly known as: FLONASE Place 1 spray into both nostrils daily as needed for allergies or rhinitis.   furosemide 20 MG tablet Commonly known as: LASIX Take 20 mg by mouth daily. For 3 days only, started 12/04/2020.   gabapentin 300 MG capsule Commonly known as: NEURONTIN Take by mouth.   ibuprofen 800 MG tablet Commonly known as: ADVIL Take by mouth.   levocetirizine 5 MG tablet Commonly known as: XYZAL Take by mouth.   lisinopril 2.5 MG tablet Commonly known as: ZESTRIL Take 2.5 mg by mouth daily. What changed: Another medication with the same name was removed. Continue taking this medication, and follow the directions you see here. Changed by: Coralyn Helling, MD   metFORMIN 500 MG 24 hr tablet Commonly known as: GLUCOPHAGE-XR Take by mouth.   metoprolol succinate 25 MG 24 hr tablet Commonly known as: TOPROL-XL Take 25 mg by mouth daily. What changed: Another medication with the same name was removed. Continue taking this medication, and follow the directions you see here. Changed by: Coralyn Helling, MD   Ozempic (0.25 or 0.5 MG/DOSE) 2 MG/1.5ML Sopn Generic drug: Semaglutide(0.25 or 0.5MG /DOS) Once weekly   pantoprazole 40 MG tablet Commonly known as: PROTONIX Take 1 tablet (40 mg total) by mouth daily.   traZODone 100 MG tablet Commonly known as: DESYREL Take 200 mg by mouth at bedtime.        Signature:  Coralyn Helling, MD [facility] Pulmonary/Critical Care Pager - [phone] 07/25/2022, 10:52 AM

## 2022-12-02 ENCOUNTER — Telehealth: Payer: Self-pay | Admitting: Pulmonary Disease

## 2022-12-02 DIAGNOSIS — G4733 Obstructive sleep apnea (adult) (pediatric): Secondary | ICD-10-CM

## 2022-12-02 NOTE — Telephone Encounter (Signed)
Patient states needs CPAP supplies. Patient uses [employer]. Fax number is [phone]. Patient phone number is [phone].

## 2022-12-06 NOTE — Telephone Encounter (Signed)
Order placed
